# Patient Record
Sex: Male | Born: 1965
Health system: Southern US, Community
[De-identification: ages and names within clinical notes are randomized; demographics above are authoritative.]

## PROBLEM LIST (undated history)

## (undated) DIAGNOSIS — S62509A Fracture of unspecified phalanx of unspecified thumb, initial encounter for closed fracture: Secondary | ICD-10-CM

## (undated) DIAGNOSIS — E781 Pure hyperglyceridemia: Secondary | ICD-10-CM

## (undated) DIAGNOSIS — B009 Herpesviral infection, unspecified: Secondary | ICD-10-CM

## (undated) DIAGNOSIS — H9190 Unspecified hearing loss, unspecified ear: Secondary | ICD-10-CM

## (undated) DIAGNOSIS — H9319 Tinnitus, unspecified ear: Secondary | ICD-10-CM

## (undated) DIAGNOSIS — R7303 Prediabetes: Secondary | ICD-10-CM

## (undated) DIAGNOSIS — F410 Panic disorder [episodic paroxysmal anxiety] without agoraphobia: Secondary | ICD-10-CM

## (undated) DIAGNOSIS — E8881 Metabolic syndrome: Secondary | ICD-10-CM

## (undated) DIAGNOSIS — F1290 Cannabis use, unspecified, uncomplicated: Secondary | ICD-10-CM

## (undated) DIAGNOSIS — S62609A Fracture of unspecified phalanx of unspecified finger, initial encounter for closed fracture: Secondary | ICD-10-CM

## (undated) DIAGNOSIS — Z862 Personal history of diseases of the blood and blood-forming organs and certain disorders involving the immune mechanism: Secondary | ICD-10-CM

## (undated) DIAGNOSIS — E041 Nontoxic single thyroid nodule: Secondary | ICD-10-CM

## (undated) DIAGNOSIS — G43909 Migraine, unspecified, not intractable, without status migrainosus: Secondary | ICD-10-CM

## (undated) HISTORY — DX: Fracture of unspecified phalanx of unspecified finger, initial encounter for closed fracture: S62.609A

## (undated) HISTORY — DX: Fracture of unspecified phalanx of unspecified thumb, initial encounter for closed fracture: S62.509A

## (undated) HISTORY — DX: Tinnitus, unspecified ear: H93.19

## (undated) HISTORY — DX: Cannabis use, unspecified, uncomplicated: F12.90

## (undated) HISTORY — DX: Prediabetes: R73.03

## (undated) HISTORY — DX: Metabolic syndrome: E88.810

## (undated) HISTORY — DX: Pure hyperglyceridemia: E78.1

## (undated) HISTORY — DX: Panic disorder (episodic paroxysmal anxiety): F41.0

## (undated) HISTORY — DX: Personal history of diseases of the blood and blood-forming organs and certain disorders involving the immune mechanism: Z86.2

## (undated) HISTORY — DX: Migraine, unspecified, not intractable, without status migrainosus: G43.909

## (undated) HISTORY — DX: Unspecified hearing loss, unspecified ear: H91.90

## (undated) HISTORY — DX: Metabolic syndrome: E88.81

## (undated) HISTORY — DX: Nontoxic single thyroid nodule: E04.1

## (undated) HISTORY — PX: WRIST SURGERY: SHX841

## (undated) HISTORY — PX: OTHER SURGICAL HISTORY: SHX169

## (undated) HISTORY — DX: Herpesviral infection, unspecified: B00.9

---

## 2015-12-16 ENCOUNTER — Emergency Department (HOSPITAL_COMMUNITY): Payer: BLUE CROSS/BLUE SHIELD

## 2015-12-16 ENCOUNTER — Inpatient Hospital Stay (HOSPITAL_COMMUNITY)
Admission: EM | Admit: 2015-12-16 | Discharge: 2015-12-19 | DRG: 511 | Disposition: A | Discharge status: Home Health | Payer: BLUE CROSS/BLUE SHIELD | Attending: Orthopedic Surgery | Admitting: Orthopedic Surgery

## 2015-12-16 DIAGNOSIS — Z791 Long term (current) use of non-steroidal anti-inflammatories (NSAID): Secondary | ICD-10-CM

## 2015-12-16 DIAGNOSIS — S52611A Displaced fracture of right ulna styloid process, initial encounter for closed fracture: Secondary | ICD-10-CM | POA: Diagnosis present

## 2015-12-16 DIAGNOSIS — S52571A Other intraarticular fracture of lower end of right radius, initial encounter for closed fracture: Principal | ICD-10-CM | POA: Diagnosis present

## 2015-12-16 DIAGNOSIS — IMO0001 Reserved for inherently not codable concepts without codable children: Secondary | ICD-10-CM

## 2015-12-16 DIAGNOSIS — S32810A Multiple fractures of pelvis with stable disruption of pelvic ring, initial encounter for closed fracture: Secondary | ICD-10-CM

## 2015-12-16 DIAGNOSIS — Z880 Allergy status to penicillin: Secondary | ICD-10-CM

## 2015-12-16 DIAGNOSIS — W1789XA Other fall from one level to another, initial encounter: Secondary | ICD-10-CM | POA: Diagnosis present

## 2015-12-16 DIAGNOSIS — Y9351 Activity, roller skating (inline) and skateboarding: Secondary | ICD-10-CM

## 2015-12-16 DIAGNOSIS — S32591A Other specified fracture of right pubis, initial encounter for closed fracture: Secondary | ICD-10-CM | POA: Diagnosis present

## 2015-12-16 DIAGNOSIS — T148XXA Other injury of unspecified body region, initial encounter: Secondary | ICD-10-CM

## 2015-12-16 DIAGNOSIS — S52501A Unspecified fracture of the lower end of right radius, initial encounter for closed fracture: Secondary | ICD-10-CM | POA: Diagnosis present

## 2015-12-16 DIAGNOSIS — Z7984 Long term (current) use of oral hypoglycemic drugs: Secondary | ICD-10-CM | POA: Diagnosis not present

## 2015-12-16 DIAGNOSIS — G5601 Carpal tunnel syndrome, right upper limb: Secondary | ICD-10-CM | POA: Diagnosis present

## 2015-12-16 DIAGNOSIS — T148 Other injury of unspecified body region: Secondary | ICD-10-CM | POA: Diagnosis present

## 2015-12-16 DIAGNOSIS — S43016A Anterior dislocation of unspecified humerus, initial encounter: Secondary | ICD-10-CM | POA: Diagnosis present

## 2015-12-16 DIAGNOSIS — S43004A Unspecified dislocation of right shoulder joint, initial encounter: Secondary | ICD-10-CM

## 2015-12-16 DIAGNOSIS — S62101A Fracture of unspecified carpal bone, right wrist, initial encounter for closed fracture: Secondary | ICD-10-CM

## 2015-12-16 LAB — CBC WITH DIFFERENTIAL/PLATELET
Basophils Absolute: 0 10*3/uL (ref 0.0–0.1)
Basophils Relative: 0 %
EOS ABS: 0 10*3/uL (ref 0.0–0.7)
EOS PCT: 0 %
HCT: 40.5 % (ref 39.0–52.0)
Hemoglobin: 14.2 g/dL (ref 13.0–17.0)
LYMPHS ABS: 1.1 10*3/uL (ref 0.7–4.0)
Lymphocytes Relative: 13 %
MCH: 29.5 pg (ref 26.0–34.0)
MCHC: 35.1 g/dL (ref 30.0–36.0)
MCV: 84.2 fL (ref 78.0–100.0)
MONOS PCT: 9 %
Monocytes Absolute: 0.8 10*3/uL (ref 0.1–1.0)
Neutro Abs: 6.8 10*3/uL (ref 1.7–7.7)
Neutrophils Relative %: 78 %
PLATELETS: 200 10*3/uL (ref 150–400)
RBC: 4.81 MIL/uL (ref 4.22–5.81)
RDW: 12.4 % (ref 11.5–15.5)
WBC: 8.6 10*3/uL (ref 4.0–10.5)

## 2015-12-16 LAB — BASIC METABOLIC PANEL
Anion gap: 6 (ref 5–15)
BUN: 10 mg/dL (ref 6–20)
CALCIUM: 8.9 mg/dL (ref 8.9–10.3)
CO2: 25 mmol/L (ref 22–32)
Chloride: 106 mmol/L (ref 101–111)
Creatinine, Ser: 0.82 mg/dL (ref 0.61–1.24)
GFR calc Af Amer: >60 mL/min (ref >60)
GLUCOSE: 100 mg/dL — AB (ref 65–99)
Potassium: 3.7 mmol/L (ref 3.5–5.1)
Sodium: 137 mmol/L (ref 135–145)

## 2015-12-16 MED ORDER — METHOCARBAMOL 1000 MG/10ML IJ SOLN
500.0000 mg | Freq: Four times a day (QID) | INTRAVENOUS | Status: DC | PRN
  Filled 2015-12-16: qty 5

## 2015-12-16 MED ORDER — ONDANSETRON HCL 4 MG/2ML IJ SOLN
4.0000 mg | Freq: Once | INTRAMUSCULAR | Status: AC
  Administered 2015-12-16: 4 mg via INTRAVENOUS
  Filled 2015-12-16: qty 2

## 2015-12-16 MED ORDER — PROPOFOL 10 MG/ML IV BOLUS
200.0000 mg | Freq: Once | INTRAVENOUS | Status: AC
  Administered 2015-12-16: 200 mg via INTRAVENOUS
  Administered 2015-12-16: 100 mg via INTRAVENOUS
  Filled 2015-12-16: qty 20

## 2015-12-16 MED ORDER — CHLORHEXIDINE GLUCONATE 4 % EX LIQD
60.0000 mL | Freq: Once | CUTANEOUS | Status: DC
  Filled 2015-12-16: qty 60

## 2015-12-16 MED ORDER — METHOCARBAMOL 500 MG PO TABS
500.0000 mg | ORAL_TABLET | Freq: Four times a day (QID) | ORAL | Status: DC | PRN
  Administered 2015-12-16 – 2015-12-19 (×7): 500 mg via ORAL
  Filled 2015-12-16 (×7): qty 1

## 2015-12-16 MED ORDER — DIPHENHYDRAMINE HCL 12.5 MG/5ML PO ELIX
12.5000 mg | ORAL_SOLUTION | ORAL | Status: DC | PRN
  Administered 2015-12-17: 25 mg via ORAL
  Filled 2015-12-16: qty 10

## 2015-12-16 MED ORDER — CLINDAMYCIN PHOSPHATE 900 MG/50ML IV SOLN: 900.0000 mg | INTRAVENOUS | Status: DC

## 2015-12-16 MED ORDER — ONDANSETRON HCL 4 MG/2ML IJ SOLN
4.0000 mg | Freq: Four times a day (QID) | INTRAMUSCULAR | Status: DC | PRN
  Administered 2015-12-16: 4 mg via INTRAVENOUS
  Filled 2015-12-16: qty 2

## 2015-12-16 MED ORDER — FENTANYL CITRATE (PF) 100 MCG/2ML IJ SOLN
100.0000 ug | INTRAMUSCULAR | Status: DC | PRN
  Administered 2015-12-16 (×2): 100 ug via INTRAVENOUS
  Filled 2015-12-16 (×2): qty 2

## 2015-12-16 MED ORDER — SODIUM CHLORIDE 0.9 % IV BOLUS (SEPSIS)
500.0000 mL | Freq: Once | INTRAVENOUS | Status: AC
  Administered 2015-12-16: 500 mL via INTRAVENOUS

## 2015-12-16 MED ORDER — HYDROMORPHONE HCL 1 MG/ML IJ SOLN
1.0000 mg | INTRAMUSCULAR | Status: DC | PRN
  Administered 2015-12-16 – 2015-12-18 (×13): 1 mg via INTRAVENOUS
  Filled 2015-12-16 (×12): qty 1

## 2015-12-16 MED ORDER — METFORMIN HCL 500 MG PO TABS
500.0000 mg | ORAL_TABLET | Freq: Two times a day (BID) | ORAL | Status: DC
  Administered 2015-12-16 – 2015-12-19 (×6): 500 mg via ORAL
  Filled 2015-12-16 (×6): qty 1

## 2015-12-16 MED ORDER — ONDANSETRON HCL 4 MG PO TABS
4.0000 mg | ORAL_TABLET | Freq: Four times a day (QID) | ORAL | Status: DC | PRN
  Administered 2015-12-17: 4 mg via ORAL
  Filled 2015-12-16 (×2): qty 1

## 2015-12-16 MED ORDER — PROPOFOL 10 MG/ML IV BOLUS
INTRAVENOUS | Status: AC
Start: 2015-12-16 — End: 2015-12-16
  Administered 2015-12-16: 100 mg via INTRAVENOUS
  Filled 2015-12-16: qty 20

## 2015-12-16 MED ORDER — ACETAMINOPHEN 325 MG PO TABS
650.0000 mg | ORAL_TABLET | Freq: Four times a day (QID) | ORAL | Status: DC | PRN
  Administered 2015-12-17: 650 mg via ORAL
  Filled 2015-12-16 (×2): qty 2

## 2015-12-16 MED ORDER — POVIDONE-IODINE 10 % EX SWAB: 2.0000 "application " | Freq: Once | CUTANEOUS | Status: DC

## 2015-12-16 MED ORDER — OXYCODONE HCL 5 MG PO TABS
5.0000 mg | ORAL_TABLET | ORAL | Status: DC | PRN
  Administered 2015-12-16 – 2015-12-19 (×12): 10 mg via ORAL
  Filled 2015-12-16 (×12): qty 2

## 2015-12-16 MED ORDER — BUPIVACAINE HCL (PF) 0.5 % IJ SOLN
20.0000 mL | Freq: Once | INTRAMUSCULAR | Status: AC
  Administered 2015-12-16: 20 mL
  Filled 2015-12-16: qty 20

## 2015-12-16 MED ORDER — CLINDAMYCIN PHOSPHATE 900 MG/50ML IV SOLN
900.0000 mg | INTRAVENOUS | Status: AC
  Administered 2015-12-17: 900 mg via INTRAVENOUS
  Filled 2015-12-16 (×2): qty 50

## 2015-12-16 MED ORDER — BISACODYL 5 MG PO TBEC: 5.0000 mg | DELAYED_RELEASE_TABLET | Freq: Every day | ORAL | Status: DC | PRN

## 2015-12-16 MED ORDER — FENTANYL CITRATE (PF) 100 MCG/2ML IJ SOLN
50.0000 ug | INTRAMUSCULAR | Status: DC | PRN
  Administered 2015-12-16: 50 ug via INTRAVENOUS
  Filled 2015-12-16 (×2): qty 2

## 2015-12-16 MED ORDER — ACETAMINOPHEN 650 MG RE SUPP: 650.0000 mg | Freq: Four times a day (QID) | RECTAL | Status: DC | PRN

## 2015-12-16 NOTE — H&P (Signed)
Jason [name] is an 50 y.o. male.   Chief Complaint: right wrist and shoulder injury HPI: Pt fell skateboarding, sustained closed right distal radius fracture and glenohumeral dislocation and right rami fractures, Pt being admitted for surgery planned on right wrist and pain control. Pt with some numbness and tingling in right hand and history of right hand carpal tunnel syndrome No surgery to right wrist. Has had surgery on left thumb and left elbow after falls.  No past medical history on file.  No past surgical history on file.  No family history on file. Social History:  has no tobacco, alcohol, and drug history on file.  Allergies:  Allergies  Allergen Reactions  . Penicillins Rash     (Not in a hospital admission)  No results found for this or any previous visit (from the past 48 hour(s)). Dg Pelvis 1-2 Views  12/16/2015  CLINICAL DATA:  Pain after 10 foot fall. EXAM: PELVIS - 1-2 VIEW COMPARISON:  None. FINDINGS: There appears to be minimally displaced fractures involving the right inferior and superior pubic rami. Hip and sacroiliac joints appear normal. IMPRESSION: Probable minimally displaced fractures involving the right inferior and superior pubic rami. Electronically Signed   By: Lupita Raider, M.D.   On: 12/16/2015 12:50   Dg Shoulder Right  12/16/2015  CLINICAL DATA:  Postreduction right shoulder anterior dislocation EXAM: RIGHT SHOULDER - 2+ VIEW COMPARISON:  Study obtained earlier in the day FINDINGS: Frontal and Y scapular images obtained. The anterior dislocation noted earlier in the day has been reduced successfully. Currently no dislocation. No acute fracture evident. Prior fracture of the junction of the mid and lateral thirds of the right clavicle again noted with 3.5 cm of overriding of fracture fragments. There old healed rib fractures on the right as well. Joint spaces appear unremarkable. IMPRESSION: Reduction of anterior shoulder dislocation. Prior  fractures of the right clavicle in multiple ribs. No acute fracture or dislocation evident on this current examination. Electronically Signed   By: Bretta Bang III M.D.   On: 12/16/2015 16:21   Dg Shoulder Right  12/16/2015  CLINICAL DATA:  Severe right shoulder pain after [place] 10 feet at [employer] [place]. History of right clavicle fracture. EXAM: RIGHT SHOULDER - 2+ VIEW COMPARISON:  None. FINDINGS: There is anterior glenohumeral dislocation without definite associated acute fracture. There is a fracture of the mid right clavicle which remains medially and inferiorly displaced. This may be incompletely healed. The acromioclavicular joint appears intact. There are multiple old right-sided rib fractures. IMPRESSION: 1. Acute anterior dislocation of the glenohumeral joint. No associated acute fracture identified. 2. Mid right clavicle fracture with persistent displacement and possible nonunion. Electronically Signed   By: Carey Bullocks M.D.   On: 12/16/2015 12:49   Dg Forearm Right  12/16/2015  CLINICAL DATA:  Patient fell from 10 ft at [employer] [place] and landed onto his right side. Patient is having severe right shoulder pain that radiates to his right elbow. Patient had little mobility of his right arm due to pain. History of clavicle and wrist fracture. EXAM: RIGHT FOREARM - 2 VIEW COMPARISON:  None. FINDINGS: There is an acute intra-articular fracture of the distal radius which is impacted, moderately comminuted and displaced. The displacement is primarily in the radial and dorsal directions. There is and mildly displaced fracture of the ulnar styloid. No definite carpal bone fractures identified on these views. The elbow is incompletely visualized but grossly intact. IMPRESSION: Displaced and impacted intra-articular fractures of the distal  radius and ulna as described. Electronically Signed   By: Carey Bullocks M.D.   On: 12/16/2015 12:51   Dg Wrist 2 Views Right  12/16/2015  CLINICAL DATA:  [place],  postreduction. Subsequent encounter. EXAM: RIGHT WRIST - 2 VIEW COMPARISON:  Plain films earlier today FINDINGS: Comminuted distal radius fracture and ulnar styloid fracture demonstrating improved position and alignment following closed reduction. Overlying splint. IMPRESSION: Improved appearance status post closed reduction. Electronically Signed   By: Elsie Stain M.D.   On: 12/16/2015 16:14   Ct Head Wo Contrast  12/16/2015  CLINICAL DATA:  10 foot fall with right arm injury. Initial encounter. EXAM: CT HEAD WITHOUT CONTRAST CT CERVICAL SPINE WITHOUT CONTRAST TECHNIQUE: Multidetector CT imaging of the head and cervical spine was performed following the standard protocol without intravenous contrast. Multiplanar CT image reconstructions of the cervical spine were also generated. COMPARISON:  None. FINDINGS: Known right glenohumeral dislocation and remote clavicle fracture. CT HEAD FINDINGS Skull and Sinuses:Negative for fracture or destructive process. The visualized mastoids, middle ears, and imaged paranasal sinuses are clear. Visualized orbits: Negative. Brain: Normal. No evidence of acute infarction, hemorrhage, hydrocephalus, or mass lesion/mass effect. CT CERVICAL SPINE FINDINGS Negative for acute fracture or subluxation. No prevertebral edema. No gross cervical canal hematoma. 35 mm (craniocaudal) left thyroid nodule projecting posteriorly. IMPRESSION: 1. No evidence of intracranial or cervical spine injury. 2. Right glenohumeral dislocation. 3. 35 mm left thyroid nodule. Recommend sonography, likely to be followed by biopsy. Electronically Signed   By: Marnee Spring M.D.   On: 12/16/2015 13:11   Ct Cervical Spine Wo Contrast  12/16/2015  CLINICAL DATA:  10 foot fall with right arm injury. Initial encounter. EXAM: CT HEAD WITHOUT CONTRAST CT CERVICAL SPINE WITHOUT CONTRAST TECHNIQUE: Multidetector CT imaging of the head and cervical spine was performed following the standard protocol without  intravenous contrast. Multiplanar CT image reconstructions of the cervical spine were also generated. COMPARISON:  None. FINDINGS: Known right glenohumeral dislocation and remote clavicle fracture. CT HEAD FINDINGS Skull and Sinuses:Negative for fracture or destructive process. The visualized mastoids, middle ears, and imaged paranasal sinuses are clear. Visualized orbits: Negative. Brain: Normal. No evidence of acute infarction, hemorrhage, hydrocephalus, or mass lesion/mass effect. CT CERVICAL SPINE FINDINGS Negative for acute fracture or subluxation. No prevertebral edema. No gross cervical canal hematoma. 35 mm (craniocaudal) left thyroid nodule projecting posteriorly. IMPRESSION: 1. No evidence of intracranial or cervical spine injury. 2. Right glenohumeral dislocation. 3. 35 mm left thyroid nodule. Recommend sonography, likely to be followed by biopsy. Electronically Signed   By: Marnee Spring M.D.   On: 12/16/2015 13:11   Dg Chest Port 1 View  12/16/2015  CLINICAL DATA:  Patient fell from 10 ft at [employer] [place] and landed onto his right side. Patient is having severe right shoulder pain that radiates to his right elbow. Patient had little mobility of his right arm due to pain. EXAM: PORTABLE CHEST 1 VIEW COMPARISON:  None. FINDINGS: Two views are submitted. The heart size and mediastinal contours are normal without evidence of mediastinal hematoma. The lungs are clear. There is no pleural effusion or pneumothorax. There are multiple old right-sided rib fractures. There is a fracture of the mid right clavicle which is displaced and possibly incompletely healed. No acute fractures are identified within the chest. Anterior glenohumeral dislocation is noted on the right. IMPRESSION: No acute chest findings. Right glenohumeral dislocation, old right-sided rib fractures and possible nonunion of mid right clavicle fracture. Electronically Signed  By: Carey BullocksWilliam  Veazey M.D.   On: 12/16/2015 12:53    ROS NO RECENT  ILLNESSES OR HOSPITALIZATIONS  Blood pressure 130/82, pulse 62, temperature 98.9 F (37.2 C), temperature source Oral, resp. rate 19, SpO2 98 %. Physical Exam  General Appearance:  Alert, cooperative, no distress, appears stated age  Head:  Normocephalic, without obvious abnormality, atraumatic  Eyes:  Pupils equal, conjunctiva/corneas clear,         Throat: Lips, mucosa, and tongue normal; teeth and gums normal  Neck: No visible masses     Lungs:   respirations unlabored  Chest Wall:  No tenderness or deformity  Heart:  Regular rate and rhythm,  Abdomen:   Soft, non-tender,         Extremities: RUE: SLING IN PLACE ABLE TO EXTEND THUMB, FLEX THUMB IP JOINT ABLE TO FLEX AND EXTEND FINGERS FINGERS WARM WELL PERFUSED  BLE: ABLE TO FLEX AND EXTEND ANKLE AND TOES NO DEFORMITIES IN LEGS GOOD PERFUSION TO TOES  Pulses: 2+ and symmetric  Skin: Skin color, texture, turgor normal, no rashes or lesions     Neurologic: Normal    Assessment/Plan RIGHT COMMINUTED DISTAL RADIUS AND ULNA FRACTURE, RIGHT HAND CARPAL TUNNEL SYNDROME RIGHT GLENOHUMERAL DISLOCATION RIGHT SUP/INFERIIOR RAMI FRACTURES  ADMIT FOR RIGHT WRIST SURGERY AND CARPAL TUNNEL RELEASE CONTINUE WITH SLING FOR SHOULDER WBAT FOR PELVIC FRACTURES, NO SURGERY RECOMMENDED IV PAIN MEDS FOR PAIN CONTROL FOR MULTIPLE FRACTURES NPO AT MIDNIGHT SHOULDER SLING FOR RIGHT SHOULDER, REDUCED IN ED CONTINUE WITH SPLINT FOR RIGHT WRIST, REDUCED IN ED ALSO BY ED STAFF   Sharma CovertORTMANN,Laure Leone W 12/16/2015, 5:01 PM

## 2015-12-16 NOTE — ED Notes (Signed)
Pt transported to xray 

## 2015-12-16 NOTE — Sedation Documentation (Signed)
50 propofol

## 2015-12-16 NOTE — Progress Notes (Signed)
Orthopedic Tech Progress Note Patient Details:  Jason [name] [birth year] [id number] Assisted with reduction of shoulder and reduction of wrist, then applied fiberglass sugar tong splint to RUE.  Pulses, sensation, motion intact before and after splinting.   Capillary refill less than 2 seconds before and after splinting.  Placed splinted RUE in arm sling. Ortho Devices Type of Ortho Device: Sugartong splint, Arm sling Ortho Device/Splint Location: RUE Ortho Device/Splint Interventions: Application   [name], [name] 12/16/2015, 3:22 PM

## 2015-12-16 NOTE — Sedation Documentation (Signed)
Family at bedside. 

## 2015-12-16 NOTE — Sedation Documentation (Signed)
100 propofol

## 2015-12-16 NOTE — Consult Note (Signed)
ORTHOPAEDIC CONSULTATION  REQUESTING PHYSICIAN: Iran Planas, MD  Chief Complaint: right shoulder dislocation, rami fractures  HPI: Jason [name] is a 50 y.o. male who complains of  He fell skateboarding and c/o R shoulder/wrist pain. Pelvic pain  No past medical history on file. No past surgical history on file. Social History   Social History  . Marital Status: Single    Spouse Name: N/A  . Number of Children: N/A  . Years of Education: N/A   Social History Main Topics  . Smoking status: Not on file  . Smokeless tobacco: Not on file  . Alcohol Use: Not on file  . Drug Use: Not on file  . Sexual Activity: Not on file   Other Topics Concern  . Not on file   Social History Narrative  . No narrative on file   No family history on file. Allergies  Allergen Reactions  . Penicillins Rash   Prior to Admission medications   Medication Sig Start Date End Date Taking? Authorizing Provider  ibuprofen (ADVIL,MOTRIN) 200 MG tablet Take 200 mg by mouth every 6 (six) hours as needed (pain).   Yes Historical Provider, MD  METFORMIN HCL PO Take 1 tablet by mouth 2 (two) times daily.   Yes Historical Provider, MD   Dg Pelvis 1-2 Views  12/16/2015  CLINICAL DATA:  Pain after 10 foot fall. EXAM: PELVIS - 1-2 VIEW COMPARISON:  None. FINDINGS: There appears to be minimally displaced fractures involving the right inferior and superior pubic rami. Hip and sacroiliac joints appear normal. IMPRESSION: Probable minimally displaced fractures involving the right inferior and superior pubic rami. Electronically Signed   By: Marijo Conception, M.D.   On: 12/16/2015 12:50   Dg Shoulder Right  12/16/2015  CLINICAL DATA:  Postreduction right shoulder anterior dislocation EXAM: RIGHT SHOULDER - 2+ VIEW COMPARISON:  Study obtained earlier in the day FINDINGS: Frontal and Y scapular images obtained. The anterior dislocation noted earlier in the day has been reduced successfully. Currently no  dislocation. No acute fracture evident. Prior fracture of the junction of the mid and lateral thirds of the right clavicle again noted with 3.5 cm of overriding of fracture fragments. There old healed rib fractures on the right as well. Joint spaces appear unremarkable. IMPRESSION: Reduction of anterior shoulder dislocation. Prior fractures of the right clavicle in multiple ribs. No acute fracture or dislocation evident on this current examination. Electronically Signed   By: Lowella Grip III M.D.   On: 12/16/2015 16:21   Dg Shoulder Right  12/16/2015  CLINICAL DATA:  Severe right shoulder pain after falling 10 feet at [employer] park. History of right clavicle fracture. EXAM: RIGHT SHOULDER - 2+ VIEW COMPARISON:  None. FINDINGS: There is anterior glenohumeral dislocation without definite associated acute fracture. There is a fracture of the mid right clavicle which remains medially and inferiorly displaced. This may be incompletely healed. The acromioclavicular joint appears intact. There are multiple old right-sided rib fractures. IMPRESSION: 1. Acute anterior dislocation of the glenohumeral joint. No associated acute fracture identified. 2. Mid right clavicle fracture with persistent displacement and possible nonunion. Electronically Signed   By: Richardean Sale M.D.   On: 12/16/2015 12:49   Dg Forearm Right  12/16/2015  CLINICAL DATA:  Patient fell from 10 ft at [employer] park and landed onto his right side. Patient is having severe right shoulder pain that radiates to his right elbow. Patient had little mobility of his right arm due to pain. History  of clavicle and wrist fracture. EXAM: RIGHT FOREARM - 2 VIEW COMPARISON:  None. FINDINGS: There is an acute intra-articular fracture of the distal radius which is impacted, moderately comminuted and displaced. The displacement is primarily in the radial and dorsal directions. There is and mildly displaced fracture of the ulnar styloid. No definite carpal bone  fractures identified on these views. The elbow is incompletely visualized but grossly intact. IMPRESSION: Displaced and impacted intra-articular fractures of the distal radius and ulna as described. Electronically Signed   By: Richardean Sale M.D.   On: 12/16/2015 12:51   Dg Wrist 2 Views Right  12/16/2015  CLINICAL DATA:  [place], postreduction. Subsequent encounter. EXAM: RIGHT WRIST - 2 VIEW COMPARISON:  Plain films earlier today FINDINGS: Comminuted distal radius fracture and ulnar styloid fracture demonstrating improved position and alignment following closed reduction. Overlying splint. IMPRESSION: Improved appearance status post closed reduction. Electronically Signed   By: Staci Righter M.D.   On: 12/16/2015 16:14   Ct Head Wo Contrast  12/16/2015  CLINICAL DATA:  10 foot fall with right arm injury. Initial encounter. EXAM: CT HEAD WITHOUT CONTRAST CT CERVICAL SPINE WITHOUT CONTRAST TECHNIQUE: Multidetector CT imaging of the head and cervical spine was performed following the standard protocol without intravenous contrast. Multiplanar CT image reconstructions of the cervical spine were also generated. COMPARISON:  None. FINDINGS: Known right glenohumeral dislocation and remote clavicle fracture. CT HEAD FINDINGS Skull and Sinuses:Negative for fracture or destructive process. The visualized mastoids, middle ears, and imaged paranasal sinuses are clear. Visualized orbits: Negative. Brain: Normal. No evidence of acute infarction, hemorrhage, hydrocephalus, or mass lesion/mass effect. CT CERVICAL SPINE FINDINGS Negative for acute fracture or subluxation. No prevertebral edema. No gross cervical canal hematoma. 35 mm (craniocaudal) left thyroid nodule projecting posteriorly. IMPRESSION: 1. No evidence of intracranial or cervical spine injury. 2. Right glenohumeral dislocation. 3. 35 mm left thyroid nodule. Recommend sonography, likely to be followed by biopsy. Electronically Signed   By: Monte Fantasia M.D.    On: 12/16/2015 13:11   Ct Cervical Spine Wo Contrast  12/16/2015  CLINICAL DATA:  10 foot fall with right arm injury. Initial encounter. EXAM: CT HEAD WITHOUT CONTRAST CT CERVICAL SPINE WITHOUT CONTRAST TECHNIQUE: Multidetector CT imaging of the head and cervical spine was performed following the standard protocol without intravenous contrast. Multiplanar CT image reconstructions of the cervical spine were also generated. COMPARISON:  None. FINDINGS: Known right glenohumeral dislocation and remote clavicle fracture. CT HEAD FINDINGS Skull and Sinuses:Negative for fracture or destructive process. The visualized mastoids, middle ears, and imaged paranasal sinuses are clear. Visualized orbits: Negative. Brain: Normal. No evidence of acute infarction, hemorrhage, hydrocephalus, or mass lesion/mass effect. CT CERVICAL SPINE FINDINGS Negative for acute fracture or subluxation. No prevertebral edema. No gross cervical canal hematoma. 35 mm (craniocaudal) left thyroid nodule projecting posteriorly. IMPRESSION: 1. No evidence of intracranial or cervical spine injury. 2. Right glenohumeral dislocation. 3. 35 mm left thyroid nodule. Recommend sonography, likely to be followed by biopsy. Electronically Signed   By: Monte Fantasia M.D.   On: 12/16/2015 13:11   Dg Chest Port 1 View  12/16/2015  CLINICAL DATA:  Patient fell from 10 ft at [employer] park and landed onto his right side. Patient is having severe right shoulder pain that radiates to his right elbow. Patient had little mobility of his right arm due to pain. EXAM: PORTABLE CHEST 1 VIEW COMPARISON:  None. FINDINGS: Two views are submitted. The heart size and mediastinal contours are normal without  evidence of mediastinal hematoma. The lungs are clear. There is no pleural effusion or pneumothorax. There are multiple old right-sided rib fractures. There is a fracture of the mid right clavicle which is displaced and possibly incompletely healed. No acute fractures are  identified within the chest. Anterior glenohumeral dislocation is noted on the right. IMPRESSION: No acute chest findings. Right glenohumeral dislocation, old right-sided rib fractures and possible nonunion of mid right clavicle fracture. Electronically Signed   By: Richardean Sale M.D.   On: 12/16/2015 12:53    Positive ROS: All other systems have been reviewed and were otherwise negative with the exception of those mentioned in the HPI and as above.  Labs cbc  Recent Labs  12/16/15 1822  WBC 8.6  HGB 14.2  HCT 40.5  PLT 200    Labs inflam No results for input(s): CRP in the last 72 hours.  Invalid input(s): ESR  Labs coag No results for input(s): INR, PTT in the last 72 hours.  Invalid input(s): PT   Recent Labs  12/16/15 1822  NA 137  K 3.7  CL 106  CO2 25  GLUCOSE 100*  BUN 10  CREATININE 0.82  CALCIUM 8.9    Physical Exam: Filed Vitals:   12/17/15 0509 12/17/15 0733  BP: 138/65   Pulse: 71   Temp: 98.4 F (36.9 C) 97.9 F (36.6 C)  Resp: 19    General: Alert, no acute distress Cardiovascular: No pedal edema Respiratory: No cyanosis, no use of accessory musculature GI: No organomegaly, abdomen is soft and non-tender Skin: No lesions in the area of chief complaint other than those listed below in MSK exam.  Neurologic: Sensation intact distally save for the below mentioned MSK exam Psychiatric: Patient is competent for consent with normal mood and affect Lymphatic: No axillary or cervical lymphadenopathy  MUSCULOSKELETAL:  RUE: splint intact, some median nerve parasthesia but motor intact compartments soft. Some TTP at shoulder BLE: compartments soft, painless ROM Other extremities are atraumatic with painless ROM and NVI.  Assessment: Right rami fracture R GH dislocation  Plan: WBAT BLE non-op for pelvic fx Sling RUE MRI to eval cuff RUE   Renette Butters, MD Cell [phone]   12/17/2015 10:06 AM

## 2015-12-16 NOTE — ED Provider Notes (Signed)
CSN: [id number]     Arrival date & time 12/16/15  1137 History   First MD Initiated Contact with Patient 12/16/15 1142     Chief Complaint  Patient presents with  . Fall  . Arm Injury      HPI  She presents for evaluation after a fall while skating at a skate park. Landed on an outstretched right upper extremity and onto his right hip and buttock. He can walk under his pain in his pelvis. Close deformity of the right wrist. Deformity the right shoulder. No strike to the head. He was wearing a helmet. No risk guards  No past medical history on file. No past surgical history on file. No family history on file. Social History  Substance Use Topics  . Smoking status: Not on file  . Smokeless tobacco: Not on file  . Alcohol Use: Not on file    Review of Systems  Constitutional: Negative for fever, chills, diaphoresis, appetite change and fatigue.  HENT: Negative for mouth sores, sore throat and trouble swallowing.   Eyes: Negative for visual disturbance.  Respiratory: Negative for cough, chest tightness, shortness of breath and wheezing.   Cardiovascular: Negative for chest pain.  Gastrointestinal: Negative for nausea, vomiting, abdominal pain, diarrhea and abdominal distention.  Endocrine: Negative for polydipsia, polyphagia and polyuria.  Genitourinary: Negative for dysuria, frequency and hematuria.  Musculoskeletal: Negative for gait problem.       Right shoulder pain and deformity. Right wrist pain and deformity. No headache or neck pain. He has pain in his pelvis above his right groin with ambulation.  Skin: Negative for color change, pallor and rash.  Neurological: Negative for dizziness, syncope, light-headedness and headaches.       Complains of numbness in his right hand.  Hematological: Does not bruise/bleed easily.  Psychiatric/Behavioral: Negative for behavioral problems and confusion.      Allergies  Penicillins  Home Medications   Prior to Admission  medications   Medication Sig Start Date End Date Taking? Authorizing Provider  ibuprofen (ADVIL,MOTRIN) 200 MG tablet Take 200 mg by mouth every 6 (six) hours as needed (pain).   Yes Historical Provider, MD  METFORMIN HCL PO Take 1 tablet by mouth 2 (two) times daily.   Yes Historical Provider, MD   BP 130/82 mmHg  Pulse 62  Temp(Src) 98.9 F (37.2 C) (Oral)  Resp 19  SpO2 98% Physical Exam  Constitutional: He is oriented to person, place, and time. He appears well-developed and well-nourished. No distress.  HENT:  Head: Normocephalic.  Eyes: Conjunctivae are normal. Pupils are equal, round, and reactive to light. No scleral icterus.  Neck: Normal range of motion. Neck supple. No thyromegaly present.  Cardiovascular: Normal rate and regular rhythm.  Exam reveals no gallop and no friction rub.   No murmur heard. Pulmonary/Chest: Effort normal and breath sounds normal. No respiratory distress. He has no wheezes. He has no rales.  Abdominal: Soft. Bowel sounds are normal. He exhibits no distension. There is no tenderness. There is no rebound.  Musculoskeletal: Normal range of motion.  Topical anterior dislocation of his right shoulder. Deformity of his right distal forearm. Reports decreased sensation to all digits of the right hand. Good capillary refill. Tenderness in the right suprapubic abdomen and along the pubic symphysis.  Neurological: He is alert and oriented to person, place, and time.  Skin: Skin is warm and dry. No rash noted.  Psychiatric: He has a normal mood and affect. His behavior is normal.  ED Course  Reduction of fracture Performed by: Rolland Porter Authorized by: Rolland Porter Consent: Verbal consent obtained. Written consent obtained. Risks and benefits: risks, benefits and alternatives were discussed Consent given by: patient Patient understanding: patient states understanding of the procedure being performed Patient consent: the patient's understanding of the  procedure matches consent given Procedure consent: procedure consent matches procedure scheduled Relevant documents: relevant documents present and verified Test results: test results available and properly labeled Site marked: the operative site was marked Imaging studies: imaging studies available Patient identity confirmed: verbally with patient and arm band Time out: Immediately prior to procedure a "time out" was called to verify the correct patient, procedure, equipment, support staff and site/side marked as required. Local anesthesia used: yes Anesthesia: hematoma block Local anesthetic: bupivacaine 0.5% without epinephrine Patient sedated: yes Sedatives: propofol Comments: Reduced via hyperextension axial traction and flexion. Splint placed. Patient reports return of "normal feeling". Normal neurovascular exam following splinting.   (including critical care time) Labs Review Labs Reviewed - No data to display  Imaging Review Dg Pelvis 1-2 Views  12/16/2015  CLINICAL DATA:  Pain after 10 foot fall. EXAM: PELVIS - 1-2 VIEW COMPARISON:  None. FINDINGS: There appears to be minimally displaced fractures involving the right inferior and superior pubic rami. Hip and sacroiliac joints appear normal. IMPRESSION: Probable minimally displaced fractures involving the right inferior and superior pubic rami. Electronically Signed   By: Lupita Raider, M.D.   On: 12/16/2015 12:50   Dg Shoulder Right  12/16/2015  CLINICAL DATA:  Severe right shoulder pain after [place] [employer] park. History of right clavicle fracture. EXAM: RIGHT SHOULDER - 2+ VIEW COMPARISON:  None. FINDINGS: There is anterior glenohumeral dislocation without definite associated acute fracture. There is a fracture of the mid right clavicle which remains medially and inferiorly displaced. This may be incompletely healed. The acromioclavicular joint appears intact. There are multiple old right-sided rib fractures.  IMPRESSION: 1. Acute anterior dislocation of the glenohumeral joint. No associated acute fracture identified. 2. Mid right clavicle fracture with persistent displacement and possible nonunion. Electronically Signed   By: Carey Bullocks M.D.   On: 12/16/2015 12:49   Dg Forearm Right  12/16/2015  CLINICAL DATA:  Patient fell from 10 ft at [employer] park and landed onto his right side. Patient is having severe right shoulder pain that radiates to his right elbow. Patient had little mobility of his right arm due to pain. History of clavicle and wrist fracture. EXAM: RIGHT FOREARM - 2 VIEW COMPARISON:  None. FINDINGS: There is an acute intra-articular fracture of the distal radius which is impacted, moderately comminuted and displaced. The displacement is primarily in the radial and dorsal directions. There is and mildly displaced fracture of the ulnar styloid. No definite carpal bone fractures identified on these views. The elbow is incompletely visualized but grossly intact. IMPRESSION: Displaced and impacted intra-articular fractures of the distal radius and ulna as described. Electronically Signed   By: Carey Bullocks M.D.   On: 12/16/2015 12:51   Dg Wrist 2 Views Right  12/16/2015  CLINICAL DATA:  Larey Seat, postreduction. Subsequent encounter. EXAM: RIGHT WRIST - 2 VIEW COMPARISON:  Plain films earlier today FINDINGS: Comminuted distal radius fracture and ulnar styloid fracture demonstrating improved position and alignment following closed reduction. Overlying splint. IMPRESSION: Improved appearance status post closed reduction. Electronically Signed   By: Elsie Stain M.D.   On: 12/16/2015 16:14   Ct Head Wo Contrast  12/16/2015  CLINICAL DATA:  10 foot fall with right arm injury. Initial encounter. EXAM: CT HEAD WITHOUT CONTRAST CT CERVICAL SPINE WITHOUT CONTRAST TECHNIQUE: Multidetector CT imaging of the head and cervical spine was performed following the standard protocol without intravenous contrast.  Multiplanar CT image reconstructions of the cervical spine were also generated. COMPARISON:  None. FINDINGS: Known right glenohumeral dislocation and remote clavicle fracture. CT HEAD FINDINGS Skull and Sinuses:Negative for fracture or destructive process. The visualized mastoids, middle ears, and imaged paranasal sinuses are clear. Visualized orbits: Negative. Brain: Normal. No evidence of acute infarction, hemorrhage, hydrocephalus, or mass lesion/mass effect. CT CERVICAL SPINE FINDINGS Negative for acute fracture or subluxation. No prevertebral edema. No gross cervical canal hematoma. 35 mm (craniocaudal) left thyroid nodule projecting posteriorly. IMPRESSION: 1. No evidence of intracranial or cervical spine injury. 2. Right glenohumeral dislocation. 3. 35 mm left thyroid nodule. Recommend sonography, likely to be followed by biopsy. Electronically Signed   By: Marnee Spring M.D.   On: 12/16/2015 13:11   Ct Cervical Spine Wo Contrast  12/16/2015  CLINICAL DATA:  10 foot fall with right arm injury. Initial encounter. EXAM: CT HEAD WITHOUT CONTRAST CT CERVICAL SPINE WITHOUT CONTRAST TECHNIQUE: Multidetector CT imaging of the head and cervical spine was performed following the standard protocol without intravenous contrast. Multiplanar CT image reconstructions of the cervical spine were also generated. COMPARISON:  None. FINDINGS: Known right glenohumeral dislocation and remote clavicle fracture. CT HEAD FINDINGS Skull and Sinuses:Negative for fracture or destructive process. The visualized mastoids, middle ears, and imaged paranasal sinuses are clear. Visualized orbits: Negative. Brain: Normal. No evidence of acute infarction, hemorrhage, hydrocephalus, or mass lesion/mass effect. CT CERVICAL SPINE FINDINGS Negative for acute fracture or subluxation. No prevertebral edema. No gross cervical canal hematoma. 35 mm (craniocaudal) left thyroid nodule projecting posteriorly. IMPRESSION: 1. No evidence of  intracranial or cervical spine injury. 2. Right glenohumeral dislocation. 3. 35 mm left thyroid nodule. Recommend sonography, likely to be followed by biopsy. Electronically Signed   By: Marnee Spring M.D.   On: 12/16/2015 13:11   Dg Chest Port 1 View  12/16/2015  CLINICAL DATA:  Patient fell from 10 ft [place] [employer] [place] and landed onto his right side. Patient is having severe right shoulder pain that radiates to his right elbow. Patient had little mobility of his right arm due to pain. EXAM: PORTABLE CHEST 1 VIEW COMPARISON:  None. FINDINGS: Two views are submitted. The heart size and mediastinal contours are normal without evidence of mediastinal hematoma. The lungs are clear. There is no pleural effusion or pneumothorax. There are multiple old right-sided rib fractures. There is a fracture of the mid right clavicle which is displaced and possibly incompletely healed. No acute fractures are identified within the chest. Anterior glenohumeral dislocation is noted on the right. IMPRESSION: No acute chest findings. Right glenohumeral dislocation, old right-sided rib fractures and possible nonunion of mid right clavicle fracture. Electronically Signed   By: Carey Bullocks M.D.   On: 12/16/2015 12:53   I have personally reviewed and evaluated these images and lab results as part of my medical decision-making.   EKG Interpretation None      MDM   Final diagnoses:  Wrist fracture, right, closed, initial encounter  Shoulder dislocation, right, initial encounter  Multiple closed fractures of pelvis with stable disruption of pelvic circle, initial encounter (HCC)    Procedural sedation Performed by: Claudean Kinds Consent: Verbal consent obtained. Risks and benefits: risks, benefits and alternatives were discussed Required items: required blood products,  implants, devices, and special equipment available Patient identity confirmed: arm band and provided demographic data Time out: Immediately  prior to procedure a "time out" was called to verify the correct patient, procedure, equipment, support staff and site/side marked as required.  Sedation type: moderate (conscious) sedation NPO time confirmed and considedered  Sedatives: PROPOFOL  Physician Time at Bedside: 330 minutes  Vitals: Vital signs were monitored during sedation. Cardiac Monitor, pulse oximeter Patient tolerance: Patient tolerated the procedure well with no immediate complications. Comments: Pt with uneventful recovered. Returned to pre-procedural sedation baseline  Reduction of dislocation Date/Time: 4:21 PM Performed by: Claudean Kinds Authorized by: Claudean Kinds Consent: Verbal consent obtained. Risks and benefits: risks, benefits and alternatives were discussed Consent given by: patient Required items: required blood products, implants, devices, and special equipment available Time out: Immediately prior to procedure a "time out" was called to verify the correct patient, procedure, equipment, support staff and site/side marked as required.  Patient sedated: Yes  Vitals: Vital signs were monitored during sedation. Patient tolerance: Patient tolerated the procedure well with no immediate complications. Joint: right shoulder Reduction technique: Scapular manipulation  SPLINT APPLICATION Date/Time: 4:24 PM Authorized by: Claudean Kinds Consent: Verbal consent obtained. Risks and benefits: risks, benefits and alternatives were discussed Consent given by: patient Splint applied by: Myself, assisted by orthopedic technician Location details: Rt wrist coaptation Splint type: orthoglass Supplies used: orthoglass, ace Post-procedure: The splinted body part was neurovascularly unchanged following the procedure. Patient tolerance: Patient tolerated the procedure well with no immediate complications.              Rolland Porter, MD 12/16/15 [phone]

## 2015-12-16 NOTE — Sedation Documentation (Addendum)
Given 70mg  more propofol after pt became alert during procedure

## 2015-12-16 NOTE — ED Notes (Signed)
Per EMS - [place] Pt went [place], fell off skateboard halfway up ramp and slid down rest of ramp. Pt was wearing helmet, did not hit head and denies LOC. Obvious deformity right wrist, right shoulder. C/o right hip pain, no deformity/shortening/rotation at this time. Pt c/o neck pain but does have hx chronic neck pain and pt uncertain as to if this pain is worse than normal. Placed in c-collar. Given 100mg  fentanyl with some relief.   Pt concerned about right wrist deformity w/ progressing right hand numbness. Pt reports previous right wrist surgeries. Dr. Fayrene FearingJames aware, no new actions to be taken at this time. Pt will have xrays and CT scans. Pt and family member updated. Pt able to move fingers slightly, good cap refill in fingers of right hand.

## 2015-12-16 NOTE — Sedation Documentation (Signed)
100 propofol 

## 2015-12-16 NOTE — ED Notes (Signed)
Procedure explained by Dr. Fayrene Fearingjames. Consent signed by pt.

## 2015-12-16 NOTE — Sedation Documentation (Signed)
Dr. Fayrene FearingJames to update family

## 2015-12-16 NOTE — Sedation Documentation (Signed)
Right shoulder reduced.

## 2015-12-16 NOTE — Sedation Documentation (Signed)
Right wrist reduced, splinting at this time

## 2015-12-16 NOTE — ED Notes (Signed)
Notified Ortho take

## 2015-12-17 ENCOUNTER — Encounter (HOSPITAL_COMMUNITY): Payer: Self-pay | Admitting: Anesthesiology

## 2015-12-17 ENCOUNTER — Inpatient Hospital Stay (HOSPITAL_COMMUNITY): Payer: BLUE CROSS/BLUE SHIELD

## 2015-12-17 ENCOUNTER — Inpatient Hospital Stay (HOSPITAL_COMMUNITY): Payer: BLUE CROSS/BLUE SHIELD | Admitting: Anesthesiology

## 2015-12-17 ENCOUNTER — Encounter (HOSPITAL_COMMUNITY)
Admission: EM | Disposition: A | Discharge status: Home Health | Payer: Self-pay | Source: Home / Self Care | Attending: Orthopedic Surgery

## 2015-12-17 HISTORY — PX: OPEN REDUCTION INTERNAL FIXATION (ORIF) DISTAL RADIAL FRACTURE: SHX5989

## 2015-12-17 HISTORY — PX: CARPAL TUNNEL RELEASE: SHX101

## 2015-12-17 LAB — SURGICAL PCR SCREEN
MRSA, PCR: NEGATIVE
STAPHYLOCOCCUS AUREUS: NEGATIVE

## 2015-12-17 LAB — GLUCOSE, CAPILLARY
Glucose-Capillary: 142 mg/dL — ABNORMAL HIGH (ref 65–99)
Glucose-Capillary: 118 mg/dL — ABNORMAL HIGH (ref 65–99)

## 2015-12-17 SURGERY — OPEN REDUCTION INTERNAL FIXATION (ORIF) DISTAL RADIUS FRACTURE
Anesthesia: General | Site: Wrist | Laterality: Right

## 2015-12-17 MED ORDER — FENTANYL CITRATE (PF) 100 MCG/2ML IJ SOLN: 25.0000 ug | INTRAMUSCULAR | Status: DC | PRN

## 2015-12-17 MED ORDER — LIDOCAINE HCL (CARDIAC) 20 MG/ML IV SOLN
INTRAVENOUS | Status: DC | PRN
  Administered 2015-12-17: 40 mg via INTRAVENOUS

## 2015-12-17 MED ORDER — METHOCARBAMOL 1000 MG/10ML IJ SOLN: 500.0000 mg | Freq: Four times a day (QID) | INTRAVENOUS | Status: DC | PRN

## 2015-12-17 MED ORDER — FENTANYL CITRATE (PF) 100 MCG/2ML IJ SOLN
INTRAMUSCULAR | Status: DC | PRN
  Administered 2015-12-17 (×2): 50 ug via INTRAVENOUS

## 2015-12-17 MED ORDER — 0.9 % SODIUM CHLORIDE (POUR BTL) OPTIME
TOPICAL | Status: DC | PRN
  Administered 2015-12-17: 1000 mL

## 2015-12-17 MED ORDER — OXYCODONE HCL 5 MG/5ML PO SOLN: 5.0000 mg | Freq: Once | ORAL | Status: DC | PRN

## 2015-12-17 MED ORDER — PROPOFOL 10 MG/ML IV BOLUS
INTRAVENOUS | Status: AC
  Filled 2015-12-17: qty 20

## 2015-12-17 MED ORDER — ONDANSETRON HCL 4 MG PO TABS: 4.0000 mg | ORAL_TABLET | Freq: Four times a day (QID) | ORAL | Status: DC | PRN

## 2015-12-17 MED ORDER — MIDAZOLAM HCL 2 MG/2ML IJ SOLN
INTRAMUSCULAR | Status: AC
  Administered 2015-12-17: 1 mg
  Filled 2015-12-17: qty 2

## 2015-12-17 MED ORDER — DOCUSATE SODIUM 100 MG PO CAPS: 100.0000 mg | ORAL_CAPSULE | Freq: Two times a day (BID) | ORAL | Status: DC

## 2015-12-17 MED ORDER — PROPOFOL 10 MG/ML IV BOLUS
INTRAVENOUS | Status: DC | PRN
  Administered 2015-12-17: 200 mg via INTRAVENOUS

## 2015-12-17 MED ORDER — OXYCODONE-ACETAMINOPHEN 5-325 MG PO TABS
1.0000 | ORAL_TABLET | ORAL | Status: DC | PRN
  Administered 2015-12-17 – 2015-12-18 (×4): 2 via ORAL
  Filled 2015-12-17 (×4): qty 2

## 2015-12-17 MED ORDER — VITAMIN C 500 MG PO TABS: 500.0000 mg | ORAL_TABLET | Freq: Every day | ORAL | Status: DC

## 2015-12-17 MED ORDER — FENTANYL CITRATE (PF) 100 MCG/2ML IJ SOLN
INTRAMUSCULAR | Status: AC
  Administered 2015-12-17: 50 ug
  Filled 2015-12-17: qty 2

## 2015-12-17 MED ORDER — FENTANYL CITRATE (PF) 250 MCG/5ML IJ SOLN
INTRAMUSCULAR | Status: AC
  Filled 2015-12-17: qty 5

## 2015-12-17 MED ORDER — METHOCARBAMOL 500 MG PO TABS: 500.0000 mg | ORAL_TABLET | Freq: Four times a day (QID) | ORAL | Status: DC

## 2015-12-17 MED ORDER — CLINDAMYCIN PHOSPHATE 600 MG/50ML IV SOLN
600.0000 mg | Freq: Four times a day (QID) | INTRAVENOUS | Status: AC
  Administered 2015-12-17 – 2015-12-18 (×3): 600 mg via INTRAVENOUS
  Filled 2015-12-17 (×4): qty 50

## 2015-12-17 MED ORDER — ONDANSETRON HCL 4 MG/2ML IJ SOLN: 4.0000 mg | Freq: Four times a day (QID) | INTRAMUSCULAR | Status: DC | PRN

## 2015-12-17 MED ORDER — ONDANSETRON HCL 4 MG/2ML IJ SOLN: 4.0000 mg | Freq: Once | INTRAMUSCULAR | Status: DC | PRN

## 2015-12-17 MED ORDER — DIPHENHYDRAMINE HCL 25 MG PO CAPS: 25.0000 mg | ORAL_CAPSULE | Freq: Four times a day (QID) | ORAL | Status: DC | PRN

## 2015-12-17 MED ORDER — ADULT MULTIVITAMIN W/MINERALS CH
1.0000 | ORAL_TABLET | Freq: Every day | ORAL | Status: DC
  Administered 2015-12-17 – 2015-12-19 (×4): 1 via ORAL
  Filled 2015-12-17 (×3): qty 1

## 2015-12-17 MED ORDER — OXYCODONE-ACETAMINOPHEN 10-325 MG PO TABS: 1.0000 | ORAL_TABLET | ORAL | Status: DC | PRN

## 2015-12-17 MED ORDER — METHOCARBAMOL 500 MG PO TABS: 500.0000 mg | ORAL_TABLET | Freq: Four times a day (QID) | ORAL | Status: DC | PRN

## 2015-12-17 MED ORDER — HYDROMORPHONE HCL 1 MG/ML IJ SOLN
0.5000 mg | INTRAMUSCULAR | Status: DC | PRN
  Administered 2015-12-18: 1 mg via INTRAVENOUS
  Filled 2015-12-17 (×2): qty 1

## 2015-12-17 MED ORDER — OXYCODONE HCL 5 MG PO TABS: 5.0000 mg | ORAL_TABLET | Freq: Once | ORAL | Status: DC | PRN

## 2015-12-17 MED ORDER — MIDAZOLAM HCL 2 MG/2ML IJ SOLN
INTRAMUSCULAR | Status: AC
  Filled 2015-12-17: qty 2

## 2015-12-17 MED ORDER — HYDROCODONE-ACETAMINOPHEN 5-325 MG PO TABS: 1.0000 | ORAL_TABLET | ORAL | Status: DC | PRN

## 2015-12-17 MED ORDER — ONDANSETRON HCL 4 MG/2ML IJ SOLN
INTRAMUSCULAR | Status: DC | PRN
  Administered 2015-12-17: 4 mg via INTRAVENOUS

## 2015-12-17 MED ORDER — CEFAZOLIN SODIUM 1-5 GM-% IV SOLN
1.0000 g | Freq: Three times a day (TID) | INTRAVENOUS | Status: DC
  Administered 2015-12-18 – 2015-12-19 (×5): 1 g via INTRAVENOUS
  Filled 2015-12-17 (×7): qty 50

## 2015-12-17 MED ORDER — ALPRAZOLAM 0.5 MG PO TABS: 0.5000 mg | ORAL_TABLET | Freq: Four times a day (QID) | ORAL | Status: DC | PRN

## 2015-12-17 MED ORDER — VITAMIN C 500 MG PO TABS
1000.0000 mg | ORAL_TABLET | Freq: Every day | ORAL | Status: DC
  Administered 2015-12-17 – 2015-12-19 (×4): 1000 mg via ORAL
  Filled 2015-12-17 (×3): qty 2

## 2015-12-17 MED ORDER — LACTATED RINGERS IV SOLN
INTRAVENOUS | Status: DC | PRN
  Administered 2015-12-17: 16:00:00 via INTRAVENOUS

## 2015-12-17 SURGICAL SUPPLY — 75 items
BANDAGE ELASTIC 3 VELCRO ST LF (GAUZE/BANDAGES/DRESSINGS) ×3 IMPLANT
BANDAGE ELASTIC 4 VELCRO ST LF (GAUZE/BANDAGES/DRESSINGS) ×3 IMPLANT
BIT DRILL 2.2 SS TIBIAL (BIT) ×3 IMPLANT
BLADE SURG ROTATE 9660 (MISCELLANEOUS) IMPLANT
BNDG ESMARK 4X9 LF (GAUZE/BANDAGES/DRESSINGS) ×3 IMPLANT
BNDG GAUZE ELAST 4 BULKY (GAUZE/BANDAGES/DRESSINGS) ×3 IMPLANT
CANISTER SUCTION 2500CC (MISCELLANEOUS) ×3 IMPLANT
CORDS BIPOLAR (ELECTRODE) ×3 IMPLANT
COVER SURGICAL LIGHT HANDLE (MISCELLANEOUS) ×3 IMPLANT
CUFF TOURNIQUET SINGLE 18IN (TOURNIQUET CUFF) ×3 IMPLANT
CUFF TOURNIQUET SINGLE 24IN (TOURNIQUET CUFF) IMPLANT
DECANTER SPIKE VIAL GLASS SM (MISCELLANEOUS) ×3 IMPLANT
DRAPE OEC MINIVIEW 54X84 (DRAPES) ×3 IMPLANT
DRAPE SURG 17X11 SM STRL (DRAPES) ×3 IMPLANT
DRAPE SURG 17X23 STRL (DRAPES) ×3 IMPLANT
DRSG ADAPTIC 3X8 NADH LF (GAUZE/BANDAGES/DRESSINGS) ×3 IMPLANT
EVACUATOR 1/8 PVC DRAIN (DRAIN) IMPLANT
GAUZE SPONGE 4X4 12PLY STRL (GAUZE/BANDAGES/DRESSINGS) ×3 IMPLANT
GAUZE SPONGE 4X4 16PLY XRAY LF (GAUZE/BANDAGES/DRESSINGS) ×3 IMPLANT
GAUZE XEROFORM 1X8 LF (GAUZE/BANDAGES/DRESSINGS) ×3 IMPLANT
GLOVE BIOGEL PI IND STRL 8.5 (GLOVE) ×2 IMPLANT
GLOVE BIOGEL PI INDICATOR 8.5 (GLOVE) ×1
GLOVE SURG ORTHO 8.0 STRL STRW (GLOVE) ×3 IMPLANT
GOWN STRL REUS W/ TWL LRG LVL3 (GOWN DISPOSABLE) ×4 IMPLANT
GOWN STRL REUS W/ TWL XL LVL3 (GOWN DISPOSABLE) ×2 IMPLANT
GOWN STRL REUS W/TWL LRG LVL3 (GOWN DISPOSABLE) ×2
GOWN STRL REUS W/TWL XL LVL3 (GOWN DISPOSABLE) ×1
K-WIRE 1.6 (WIRE) ×2
K-WIRE FX5X1.6XNS BN SS (WIRE) ×4
KIT BASIN OR (CUSTOM PROCEDURE TRAY) ×3 IMPLANT
KIT ROOM TURNOVER OR (KITS) ×3 IMPLANT
KWIRE FX5X1.6XNS BN SS (WIRE) ×4 IMPLANT
LOOP VESSEL MAXI BLUE (MISCELLANEOUS) IMPLANT
NEEDLE HYPO 25GX1X1/2 BEV (NEEDLE) IMPLANT
NEEDLE HYPO 25X1 1.5 SAFETY (NEEDLE) ×3 IMPLANT
NS IRRIG 1000ML POUR BTL (IV SOLUTION) ×3 IMPLANT
PACK ORTHO EXTREMITY (CUSTOM PROCEDURE TRAY) ×3 IMPLANT
PAD ARMBOARD 7.5X6 YLW CONV (MISCELLANEOUS) ×6 IMPLANT
PAD CAST 4YDX4 CTTN HI CHSV (CAST SUPPLIES) ×2 IMPLANT
PADDING CAST COTTON 4X4 STRL (CAST SUPPLIES) ×1
PLATE RIGHT VOLAR RIM DVR (Plate) ×3 IMPLANT
SCREW LOCK 16X2.7X 3 LD TPR (Screw) ×8 IMPLANT
SCREW LOCK 18X2.7X 3 LD TPR (Screw) ×2 IMPLANT
SCREW LOCK 22X2.7X 3 LD TPR (Screw) ×2 IMPLANT
SCREW LOCK 24X2.7X3 LD THRD (Screw) ×8 IMPLANT
SCREW LOCK 26X2.7X 3 LD TPR (Screw) ×4 IMPLANT
SCREW LOCKING 2.7X16 (Screw) ×4 IMPLANT
SCREW LOCKING 2.7X18 (Screw) ×1 IMPLANT
SCREW LOCKING 2.7X22MM (Screw) ×1 IMPLANT
SCREW LOCKING 2.7X24MM (Screw) ×4 IMPLANT
SCREW LOCKING 2.7X26MM (Screw) ×2 IMPLANT
SCREW NONLOCK 2.7X30MM (Screw) ×3 IMPLANT
SLING ARM FOAM STRAP LRG (SOFTGOODS) ×3 IMPLANT
SOAP 2 % CHG 4 OZ (WOUND CARE) ×3 IMPLANT
SPLINT FIBERGLASS 3X35 (CAST SUPPLIES) ×3 IMPLANT
SPONGE LAP 4X18 X RAY DECT (DISPOSABLE) ×3 IMPLANT
SUCTION FRAZIER HANDLE 10FR (MISCELLANEOUS)
SUCTION TUBE FRAZIER 10FR DISP (MISCELLANEOUS) IMPLANT
SUT PROLENE 3 0 PS 1 (SUTURE) ×6 IMPLANT
SUT PROLENE 4 0 PS 2 18 (SUTURE) ×6 IMPLANT
SUT PROLENE 6 0 P 1 18 (SUTURE) ×3 IMPLANT
SUT VIC AB 2-0 CT1 27 (SUTURE)
SUT VIC AB 2-0 CT1 TAPERPNT 27 (SUTURE) IMPLANT
SUT VIC AB 2-0 FS1 27 (SUTURE) ×6 IMPLANT
SUT VIC AB 3-0 FS2 27 (SUTURE) IMPLANT
SUT VIC AB 4-0 PS2 27 (SUTURE) ×6 IMPLANT
SUT VICRYL 4-0 PS2 18IN ABS (SUTURE) IMPLANT
SYR CONTROL 10ML LL (SYRINGE) IMPLANT
SYSTEM CHEST DRAIN TLS 7FR (DRAIN) IMPLANT
TOWEL OR 17X24 6PK STRL BLUE (TOWEL DISPOSABLE) ×3 IMPLANT
TOWEL OR 17X26 10 PK STRL BLUE (TOWEL DISPOSABLE) ×3 IMPLANT
TUBE CONNECTING 12X1/4 (SUCTIONS) ×3 IMPLANT
UNDERPAD 30X30 INCONTINENT (UNDERPADS AND DIAPERS) ×3 IMPLANT
WATER STERILE IRR 1000ML POUR (IV SOLUTION) ×3 IMPLANT
YANKAUER SUCT BULB TIP NO VENT (SUCTIONS) IMPLANT

## 2015-12-17 NOTE — Progress Notes (Signed)
PT Cancellation Note  Patient Details Name: Jason CookeyShannon [name] MRN: [id number] DOB: [birth year]   Cancelled Treatment:    Reason Eval/Treat Not Completed: Patient not medically ready. Pt scheduled for OR this afternoon for wrist surgery. Will hold PT eval today and complete evaluation when able post-surgery.   Conni SlipperKirkman, Lamontae Ricardo 12/17/2015, 1:49 PM   Conni SlipperLaura Shelbey Spindler, PT, DPT Acute Rehabilitation Services Pager: [phone]

## 2015-12-17 NOTE — Progress Notes (Signed)
OT Cancellation Note  Patient Details Name: Toy CookeyShannon Coello MRN: [id number] DOB: [birth year]   Cancelled Treatment:    Reason Eval/Treat Not Completed: Pain limiting ability to participate;Other (comment). Pt declined OT eval today stating that he has severe headache and pain in R UE. Pt's nurse notified of request for pain meds and refusal of OT eval. Pt also scheduled to go to OR for wrist surgery this afternoon. Will re attempt when appropriate  Galen ManilaSpencer, Altovise Wahler Jeanette 12/17/2015, 10:58 AM

## 2015-12-17 NOTE — Anesthesia Postprocedure Evaluation (Signed)
Anesthesia Post Note  Patient: Jason [name]  Procedure(s) Performed: Procedure(s) (LRB): OPEN REDUCTION INTERNAL FIXATION (ORIF) RIGHT DISTAL RADIUS FRACTURE (Right) CARPAL TUNNEL RELEASE (Right)  Patient location during evaluation: PACU Anesthesia Type: General Level of consciousness: awake and alert Pain management: pain level controlled Vital Signs Assessment: post-procedure vital signs reviewed and stable Respiratory status: spontaneous breathing, nonlabored ventilation, respiratory function stable and patient connected to nasal cannula oxygen Cardiovascular status: blood pressure returned to baseline and stable Postop Assessment: no signs of nausea or vomiting Anesthetic complications: no    Last Vitals:  Filed Vitals:   12/17/15 1825 12/17/15 1837  BP: 148/88 146/81  Pulse: 65 63  Temp:    Resp: 13 11    Last Pain:  Filed Vitals:   12/17/15 1838  PainSc: 7                  Mihran Lebarron,JAMES TERRILL

## 2015-12-17 NOTE — Progress Notes (Signed)
Pharmacy Antibiotic Note  Jason CookeyShannon [name] is a 50 y.o. male admitted on 12/16/2015 with a R-wrist and shoulder injury and is now s/p ORIF of the R-radial fracture and R-carpal tunnel release.   Pharmacy has been consulted for post-op Clindamycin dosing.  Per the post-op Orthopedic orderset - the usual Clindamycin dosing is 600 mg every 6 hours for 3 doses. Will enter this post-operatively for this patient.   The patient received Clindamycin 900 mg IV x 1 dose at 1600 earlier today  Plan: 1. Clindamycin 600 mg every 6 hours for 3 doses 2. Pharmacy will sign off as no further doses expected at this time.      Temp (24hrs), Avg:98.2 F (36.8 C), Min:97.9 F (36.6 C), Max:98.7 F (37.1 C)   Recent Labs Lab 12/16/15 1822  WBC 8.6  CREATININE 0.82    CrCl cannot be calculated (Unknown ideal weight.).    Allergies  Allergen Reactions  . Penicillins Rash   Thank you for allowing pharmacy to be a part of this patient's care.  Georgina PillionElizabeth Sultana Tierney, PharmD, BCPS Clinical Pharmacist Pager: [phone] 12/17/2015 7:07 PM

## 2015-12-17 NOTE — Progress Notes (Signed)
R/B/A DISCUSSED WITH PT IN HOSPITAL.  PT VOICED UNDERSTANDING OF PLAN CONSENT SIGNED DAY OF SURGERY PT SEEN AND EXAMINED PRIOR TO OPERATIVE PROCEDURE/DAY OF SURGERY SITE MARKED. QUESTIONS ANSWERED WILL BE ADMITTED FOLLOWING SURGERY WE ARE PLANNING SURGERY FOR YOUR UPPER EXTREMITY. THE RISKS AND BENEFITS OF SURGERY INCLUDE BUT NOT LIMITED TO BLEEDING INFECTION, DAMAGE TO NEARBY NERVES ARTERIES TENDONS, FAILURE OF SURGERY TO ACCOMPLISH ITS INTENDED GOALS, PERSISTENT SYMPTOMS AND NEED FOR FURTHER SURGICAL INTERVENTION. WITH THIS IN MIND WE WILL PROCEED. I HAVE DISCUSSED WITH THE PATIENT THE PRE AND POSTOPERATIVE REGIMEN AND THE DOS AND DON'TS. PT VOICED UNDERSTANDING AND INFORMED CONSENT SIGNED. 

## 2015-12-17 NOTE — Brief Op Note (Signed)
12/16/2015 - 12/17/2015  3:55 PM  PATIENT:  Jason [name]  50 y.o. male  PRE-OPERATIVE DIAGNOSIS:  RIGHT RADIUS FRACTURE; RIGHT CARPAL TUNNEL SYNDROME  POST-OPERATIVE DIAGNOSIS:  * No post-op diagnosis entered *  PROCEDURE:  Procedure(s): OPEN REDUCTION INTERNAL FIXATION (ORIF) DISTAL RADIAL FRACTURE (Right) CARPAL TUNNEL RELEASE (Right)  SURGEON:  Surgeon(s) and Role:    * Bradly BienenstockFred Fianna Snowball, MD - Primary  PHYSICIAN ASSISTANT:   ASSISTANTS: none   ANESTHESIA:   general  EBL:  Total I/O In: 0  Out: 800 [Urine:800]  BLOOD ADMINISTERED:none  DRAINS: none   LOCAL MEDICATIONS USED:  NONE  SPECIMEN:  No Specimen  DISPOSITION OF SPECIMEN:  N/A  COUNTS:  YES  TOURNIQUET:    DICTATION: .Other Dictation: Dictation Number [id number]  PLAN OF CARE: Admit to inpatient   PATIENT DISPOSITION:  PACU - hemodynamically stable.   Delay start of Pharmacological VTE agent (>24hrs) due to surgical blood loss or risk of bleeding: not applicable

## 2015-12-17 NOTE — Progress Notes (Signed)
Attempted MRI before pt went to surgery but scanner went down, OR called and wanted pt to come on up, pt sent to OR, per OR MD pt can be done after surgery is done

## 2015-12-17 NOTE — Anesthesia Procedure Notes (Addendum)
Procedure Name: LMA Insertion Date/Time: 12/17/2015 4:11 PM Performed by: Gavin PoundLOWDER, HAL J Pre-anesthesia Checklist: Patient identified, Timeout performed, Emergency Drugs available, Suction available and Patient being monitored Patient Re-evaluated:Patient Re-evaluated prior to inductionOxygen Delivery Method: Circle system utilized Preoxygenation: Pre-oxygenation with 100% oxygen Intubation Type: IV induction Ventilation: Mask ventilation without difficulty LMA: LMA inserted LMA Size: 5.0 Number of attempts: 1 Placement Confirmation: positive ETCO2 and breath sounds checked- equal and bilateral Tube secured with: Tape Dental Injury: Teeth and Oropharynx as per pre-operative assessment    Anesthesia Regional Block:  Supraclavicular block  Pre-Anesthetic Checklist: ,, timeout performed, Correct Patient, Correct Site, Correct Laterality, Correct Procedure, Correct Position, site marked, Risks and benefits discussed,  Surgical consent,  Pre-op evaluation,  At surgeon's request and post-op pain management  Laterality: Right  Prep: chloraprep       Needles:  Injection technique: Single-shot  Needle Type: Echogenic Stimulator Needle     Needle Length: 9cm 9 cm Needle Gauge: 21 and 21 G    Additional Needles:  Procedures: ultrasound guided (picture in chart) Supraclavicular block Narrative:  Start time: 12/17/2015 3:50 PM End time: 12/17/2015 3:55 PM Injection made incrementally with aspirations every 5 mL.  Performed by: Personally   Additional Notes: 30 cc 0.5% Bupivacaine injected easily

## 2015-12-17 NOTE — Progress Notes (Signed)
CRNA at bedside preparing to take pt back to OR. 

## 2015-12-17 NOTE — Progress Notes (Signed)
Betadine nasal wash explained to pt but pt refused, reported, "I am too stressed and I have a headache".

## 2015-12-17 NOTE — Transfer of Care (Signed)
Immediate Anesthesia Transfer of Care Note  Patient: Jason [name]  Procedure(s) Performed: Procedure(s): OPEN REDUCTION INTERNAL FIXATION (ORIF) RIGHT DISTAL RADIUS FRACTURE (Right) CARPAL TUNNEL RELEASE (Right)  Patient Location: PACU  Anesthesia Type:General  Level of Consciousness: awake  Airway & Oxygen Therapy: Patient Spontanous Breathing  Post-op Assessment: Report given to RN and Post -op Vital signs reviewed and stable  Post vital signs: Reviewed and stable  Last Vitals:  Filed Vitals:   12/17/15 1824 12/17/15 1825  BP:  148/88  Pulse:  65  Temp: 36.6 C   Resp:  13    Last Pain:  Filed Vitals:   12/17/15 1827  PainSc: 7       Patients Stated Pain Goal: 3 (12/17/15 [id number])  Complications: No apparent anesthesia complications

## 2015-12-18 ENCOUNTER — Encounter (HOSPITAL_COMMUNITY): Payer: Self-pay | Admitting: Orthopedic Surgery

## 2015-12-18 ENCOUNTER — Inpatient Hospital Stay (HOSPITAL_COMMUNITY): Payer: BLUE CROSS/BLUE SHIELD

## 2015-12-18 MED ORDER — HYDROMORPHONE HCL 2 MG PO TABS: 2.0000 mg | ORAL_TABLET | ORAL | Status: DC | PRN

## 2015-12-18 MED ORDER — HYDROMORPHONE HCL 2 MG PO TABS
2.0000 mg | ORAL_TABLET | ORAL | Status: DC | PRN
  Administered 2015-12-19 (×4): 2 mg via ORAL
  Filled 2015-12-18 (×5): qty 1

## 2015-12-18 NOTE — Op Note (Signed)
Jason [name], BOUCH NO.:  [id number]  MEDICAL RECORD NO.:  [id number]  LOCATION:  [id number]                        FACILITY:  [facility]  PHYSICIAN:  Sharma Covert IV, M.D.DATE OF BIRTH:  04-25-1966  DATE OF PROCEDURE:  12/17/2015 DATE OF DISCHARGE:                              OPERATIVE REPORT   PREOPERATIVE DIAGNOSES: 1. Right wrist comminuted intra-articular distal radius fracture three     or more fragments. 2. Right hand carpal tunnel syndrome.  POSTOPERATIVE DIAGNOSES: 1. Right wrist comminuted intra-articular distal radius fracture three     or more fragments. 2. Right hand carpal tunnel syndrome.  ATTENDING SURGEON:  Sharma Covert, M.D., who was scrubbed and present for the entire procedure.  ASSISTANT SURGEON:  None.  ANESTHESIA:  Supraclavicular block with general anesthesia via LMA.  PROCEDURE: 1.Open treatment right wrist radius fracture, intraarticular of 3 or more fragments with internal fixation 2. Radiograph, 3 views, right wrist.   3.Right wrist brachioradialis tendon release and tenotomy.  TOURNIQUET TIME:  Less than 75 minutes at 250 mmHg.  SURGICAL IMPLANTS:  DVR Crosslock volar rim plate with locking screws distally and locking screws proximally.  RADIOGRAPHIC INTERPRETATION:  AP, lateral and oblique views of the wrist did show the volar plate fixation in place.  There was good alignment of the radiocarpal, inclination, volar tilt and height.  SURGICAL INDICATION:  Jason [name] is a 50 year old gentleman who was skateboarding and fell on an outstretched right wrist, sustaining a highly-comminuted intra-articular distal radius fracture.  The patient was seen and evaluated and recommended to undergo the above procedure. Risks, benefits and alternatives were discussed in detail with the patient and signed informed consent was obtained.  The patient has also had preoperative symptoms of the carpal tunnel and some median  nerve symptoms and carpal tunnel release was performed at the time of the surgery, recommended to perform at the concomitant time.  Risks include, but not limited to bleeding, infection, damage to nearby nerves, arteries, or tendons; loss of motion of the wrist and digits; incomplete relief of symptoms and need for further surgical intervention.  DESCRIPTION OF PROCEDURE:  The patient was properly identified in the preoperative holding area and marked with a permanent marker made on the right hand to indicate the correct operative site.  The patient was brought back to the operating room and placed supine on the anesthesia table where general anesthesia was administered, the patient tolerated this well.  A well-padded tourniquet was placed on the right brachium and sealed with 1000-drape.  The right upper extremity was then prepped and draped in normal sterile fashion.  Time-out was called, the correct side was identified and procedure was then begun.  Attention was then turned to the right wrist.  The several centimeter incision was made in the midpalm.  The limb was then elevated and tourniquet insufflated. Preoperative antibiotics were given.  Incision was made in the midpalm. Dissection was carried down through the skin and subcutaneous tissue. The palmar fascia was incised longitudinally.  Going on the ulnar leaflet of the carpal canal, transverse carpal ligament was incised longitudinally and released under direct visualization distally. Further exposure was then carried out proximally to  the remaining portions of the transverse carpal ligament as well as portion of the antebrachial fascia was then released.  The wound was then thoroughly irrigated.  The patient did have moderate hematoma within the carpal canal.  Wound was irrigated and the skin was then closed using 3-0 Prolene sutures.  There was separate incision.  A longitudinal incision was made directly over the FCR sheath.   Dissection was carried down through the skin and subcutaneous tissue.  The FCR sheath was then opened.  Going through the floor of the FCR sheath, the FPL was then swept out of the way and the pronator quadratus was then elevated.  The pronator quadratus was then elevated in an L-shaped fashion.  Dissection was carried down into the fracture, shaped in order to release the radial column.  The brachioradialis was then carefully released protecting the first dorsal compartment tendons.  After release of the brachioradialis, I was able to reduce the radial columns, this was highly-comminuted intra-articular distal radius fracture three or more fragments.  Following this, open reduction was then performed.  The radial column was then reduced to the central column and the ulnar column with a reduction clamp.  The volar rim plate was then applied. After application of the volar rim plate, it was held with the K-wires. The position was then confirmed using the mini C-arm.  After plate confirmation, the wound was then irrigated and then distal fixation was carried out with the distal locking screw, bicortical engaging the dorsal cortex bringing the plate down.  Following this, position was then confirmed using mini C-arm and distal fixation was carried out from an ulnar to radial direction and with the distal locking screws. Confirmation was then made to make sure that the screws did not penetrate the articular surface.  The patient did have a highly- comminuted injury and small central depression noted.  Following this, fixation was then carried out from an ulnar to radial direction of the highly-comminuted fracture.  This was done with distal locking screws. The nonlocking screw was then replaced with a locking screw.  Final fixation was then carried out in the shaft.  The wound was then irrigated.  Copious wound irrigation done.  Final radiographs were then obtained.  Stress radiography was  then carried out.  There was not appeared to be widening of the FCL interval.  The distal radioulnar joint had good congruency without instability with a small ulnar styloid fracture.  Once this was carried out, the pronator quadratus was then closed with 2-0 Vicryl suture.  The tourniquet was deflated.  The patient did appear to have a sidewall injury to the radial artery. After noting this once the tourniquet was down the radial artery, small vessel clamps were then applied on the radial artery and the radial artery was then repaired with a 6-0 Prolene suture.  There was good flow through the radial artery and a sidewall injury to the radial artery had been repaired with good flow and well-perfused hand.  This was done with a running 6-0 Prolene suture.  Following this, the subcutaneous tissues were then closed with 4-0 Vicryl and the skin loosely reapproximated with 3-0 Prolene.  Adaptic dressing and sterile compressive bandage were then applied.  The patient was then placed in a well-padded sugar-tong splint, extubated and taken to the recovery room in good condition.  POSTPROCEDURAL PLAN:  The patient admitted overnight for IV antibiotics and pain control, discharged when his pain is controlled.  He is up ambulating  with PT, and his shoulder and his pelvis are in comfortable state and plan to see him back in 2 weeks, x-rays, suture removal and application of a short-arm cast for total of 3 weeks, 5 weeks immobilization and begin an outpatient therapy regimen at that point and radiographs at each visit.  PROGNOSIS:  The patient did have a highly-comminuted pilon-type fracture of the distal radius with highly comminution.  There was felt to be good restoration of the anatomical radial height, length and tilt.  The patient did have comminuted segment within the central and the dorsal region of the wrist, but with good stability with a volar plate fixation.     Madelynn Done,  M.D.     FWO/MEDQ  D:  12/17/2015  T:  12/18/2015  Job:  [id number]

## 2015-12-18 NOTE — Progress Notes (Signed)
PT SEEN/EXAMINED DOING BETTER THIS EVENING WILL HOLD ON MRI UNTIL OUTPATIENT PT DID NOT HAVE GOOD EXPERIENCE WITH MRI YESTERDAY PLAN FOR LIKELY D/C HOME IN AM FINGERS WARM WELL PERFUSED ABLE TO FLEX AND EXTEND THUMB LIMITED DIGITAL MOTION

## 2015-12-18 NOTE — Progress Notes (Signed)
Occupational Therapy Evaluation Patient Details Name: Jason [name] MRN: [id number] DOB: 05-Apr-1966 Today's Date: 12/18/2015    History of Present Illness Pt is a 50 y.o male who fell skateboarding, sustained closed right distal radius fracture and glenohumeral dislocation and right rami fractures, now s/p ORIF Rt radial fx and carpal tunnel release. Rami fx being treated nonoperatively. PMH: none on file, pt reports a history of multiple fracture but not specified.    Clinical Impression   PTA, pt independent with mobility and ADL. Pt currently requires min A with mobility and mod A with ADL after max encouragement to participate. Girlfriend presnt for session. Received clarification from Dr. Orlan Leavens regarding NWB RUE, no ROM R shoulder, and sling at all times with the exception of ADL. Pt only able to ambulate short distance due to pain and complaints of dizziness. Feel pt will most likely able to D/C home tomorrow with intermittent S and HHOT and 3 in 1. Will see in am.    Follow Up Recommendations  Home health OT;Supervision/Assistance - 24 hour (initially)    Equipment Recommendations  3 in 1 bedside comode    Recommendations for Other Services       Precautions / Restrictions Precautions Precautions: Fall Required Braces or Orthoses: Sling (Rt shoulder) Restrictions Weight Bearing Restrictions: Yes RUE Weight Bearing: Non weight bearing RLE Weight Bearing: Weight bearing as tolerated      Mobility Bed Mobility Overal bed mobility: Needs Assistance Bed Mobility: Supine to Sit     Supine to sit: Supervision;HOB elevated     General bed mobility comments: using rail with Lt UE to assist to sitting. Reports feeling dizzy with initial sitting EOB.   Transfers Overall transfer level: Needs assistance Equipment used: None Transfers: Sit to/from [employer] Sit to Stand: Min guard Stand pivot transfers: Min assist;+2 safety/equipment       General  transfer comment: Using rail with Lt UE to assist to standing then holding [facility] for assist with stability.     Balance Overall balance assessment: Needs assistance Sitting-balance support: No upper extremity supported Sitting balance-Leahy Scale: Good     Standing balance support: Single extremity supported Standing balance-Leahy Scale: Fair Standing balance comment: mild instability but no gross loss of balance.                             ADL Overall ADL's : Needs assistance/impaired     Grooming: Set up Grooming Details (indicate cue type and reason): although pt asking fiance to wash his face Upper Body Bathing: Moderate assistance;Sitting   Lower Body Bathing: Moderate assistance;Sit to/from stand   Upper Body Dressing : Moderate assistance;Sitting   Lower Body Dressing: Moderate assistance;Sit to/from stand   Toilet Transfer: Minimal assistance;Ambulation (cane)   Toileting- Clothing Manipulation and Hygiene: Set up Toileting - Clothing Manipulation Details (indicate cue type and reason): pt independently using urinal after set up     Functional mobility during ADLs: Minimal assistance;Cane;+2 for safety/equipment General ADL Comments: Pt required max encouragement to participate with mobility     Vision     Perception     Praxis      Pertinent Vitals/Pain Pain Assessment: 0-10 Pain Score: 8  Faces Pain Scale: Hurts whole lot Pain Location: R wrist Pain Descriptors / Indicators: Aching;Grimacing;Guarding;Moaning Pain Intervention(s): Limited activity within patient's tolerance;Repositioned;Ice applied     Hand Dominance Right   Extremity/Trunk Assessment Upper Extremity Assessment Upper Extremity Assessment: RUE deficits/detail  RUE Deficits / Details: minimal movement observed R fingers due to pain. Shoulder not tested due to dislocation RUE Coordination: decreased fine motor;decreased gross motor   Lower Extremity Assessment Lower  Extremity Assessment: Defer to PT evaluation RLE Deficits / Details: Pt able to move Rt LE independently with bed mobility but slow and guarded. Difficulty with hip flexion during ambulation (appears pain related).    Cervical / Trunk Assessment Cervical / Trunk Assessment: Normal   Communication Communication Communication: No difficulties   Cognition Arousal/Alertness: Awake/alert Behavior During Therapy: Agitated (Agitated with request to attempt to mobilize) Overall Cognitive Status: Within Functional Limits for tasks assessed                     General Comments       Exercises Exercises: Other exercises Other Exercises Other Exercises: encouraged frequent digit ROM Other Exercises: use of ice for edeman control   Shoulder Instructions      Home Living Family/patient expects to be discharged to:: Private residence Living Arrangements: Spouse/significant other Available Help at Discharge: Friend(s);Available PRN/intermittently Type of Home: House Home Access: Stairs to enter Enter[employer] CorporationEntrance Stairs-Number of Steps: 3-4 Entrance Stairs-Rails: None Home Layout: One level     Bathroom Shower/Tub: Producer, television/film/videoWalk-in shower   Bathroom Toilet: Standard Bathroom Accessibility: Yes How Accessible: Accessible via walker Home Equipment: None   Additional Comments: fiance to stay with patient      Prior Functioning/Environment Level of Independence: Independent             OT Diagnosis: Generalized weakness;Acute pain   OT Problem List: Decreased strength;Decreased activity tolerance;Decreased range of motion;Decreased coordination;Decreased safety awareness;Decreased knowledge of use of DME or AE;Decreased knowledge of precautions;Impaired UE functional use;Pain;Increased edema   OT Treatment/Interventions: Self-care/ADL training;Therapeutic exercise;DME and/or AE instruction;Therapeutic activities;Patient/family education;Balance training    OT Goals(Current goals can be  found in the care plan section) Acute Rehab OT Goals Patient Stated Goal: have less pain OT Goal Formulation: With patient Time For Goal Achievement: 01/01/16 Potential to Achieve Goals: Good  OT Frequency: Min 2X/week   Barriers to D/C:            Co-evaluation PT/OT/SLP Co-Evaluation/Treatment: Yes Reason for Co-Treatment: For patient/therapist safety;Necessary to address cognition/behavior during functional activity PT goals addressed during session: Mobility/safety with mobility OT goals addressed during session: ADL's and self-care      End of Session Equipment Utilized During Treatment: Gait belt;Other (comment) Gilmer Mor(Cane) Nurse Communication: Mobility status;Weight bearing status;Precautions  Activity Tolerance: Patient limited by pain Patient left: in chair;with call bell/phone within reach;with family/visitor present   Time: [id number] OT Time Calculation (min): 44 min Charges:  OT General Charges $OT Visit: 1 Procedure OT Evaluation $OT Eval Moderate Complexity: 1 Procedure OT Treatments $Self Care/Home Management : 8-22 mins G-Codes:    Vansh Reckart,HILLARY 12/18/2015, 2:47 PM   [facility] Layla Kesling, OTR/L  [phone] 12/18/2015

## 2015-12-18 NOTE — Discharge Instructions (Signed)
KEEP BANDAGE CLEAN AND DRY CALL OFFICE FOR F/U APPT [phone] in 14 days [facility] [phone] KEEP HAND ELEVATED ABOVE HEART OK TO APPLY ICE TO OPERATIVE AREA CONTACT OFFICE IF ANY WORSENING PAIN OR CONCERNS. KEEP HAND ELEVATED ABOVE HEART IF POSSIBLE SLING FOR RIGHT SHOULDER WEIGHT BEAR AS TOLERATED RIGHT LEG

## 2015-12-18 NOTE — Progress Notes (Signed)
OT Cancellation Note  Patient Details Name: Jason [name] MRN: [id number] DOB: [birth year]   Cancelled Treatment:    Reason Eval/Treat Not Completed: Other (comment) Pt has declined x 2 this am. Initially due to pain and then due to anxiety. Have called Dr Orlan Leavensrtman asking about clarification for WBS RUE, possibility of using platform RW, sling use and ROM R shoulder. Will attempt again. Nsg aware.  [facility]  Tashaun Obey, OTR/L  [phone] 12/18/2015 12/18/2015, 10:01 AM

## 2015-12-18 NOTE — Progress Notes (Signed)
7pm  Sent for pt and pt refused to come down for exam, patient stated that he wanted to talk with Md in regards to MRI

## 2015-12-18 NOTE — Discharge Summary (Signed)
Physician Discharge Summary  Patient ID: Jason [name] MRN: [id number] DOB/AGE: [id number] y.o.  Admit date: 12/16/2015 Discharge date: 12/19/2015  Admission Diagnoses: RIGHT RADIUS FRACTURE; RIGHT CARPAL TUNNEL SYNDROME History reviewed. No pertinent past medical history.  Discharge Diagnoses:  Active Problems:   Fracture of right distal radius RIGHT GLENOHUMERAL DISLOCATION RIGHT SUP/INFERIOR RAMI FRACTURES  Surgeries: Procedure(s): OPEN REDUCTION INTERNAL FIXATION (ORIF) RIGHT DISTAL RADIUS FRACTURE CARPAL TUNNEL RELEASE on 12/16/2015 - 12/17/2015    Consultants: Treatment Team:  Sheral Apley, MD  Discharged Condition: Improved  Hospital Course: [name] is an 50 y.o. male who was admitted 12/16/2015 with a chief complaint of Chief Complaint  Patient presents with  . Fall  . Arm Injury  , and found to have a diagnosis of RIGHT RADIUS FRACTURE; RIGHT CARPAL TUNNEL SYNDROME.  They were brought to the operating room on 12/16/2015 - 12/17/2015 and underwent Procedure(s): OPEN REDUCTION INTERNAL FIXATION (ORIF) RIGHT DISTAL RADIUS FRACTURE CARPAL TUNNEL RELEASE.    They were given perioperative antibiotics: Anti-infectives    Start     Dose/Rate Route Frequency Ordered Stop   12/18/15 0015  ceFAZolin (ANCEF) IVPB 1 g/50 mL premix     1 g 100 mL/hr over 30 Minutes Intravenous Every 8 hours 12/17/15 1851     12/17/15 2200  clindamycin (CLEOCIN) IVPB 600 mg     600 mg 100 mL/hr over 30 Minutes Intravenous Every 6 hours 12/17/15 1908 12/18/15 1307   12/17/15 0600  clindamycin (CLEOCIN) IVPB 900 mg     900 mg 100 mL/hr over 30 Minutes Intravenous On call to O.R. 12/16/15 1757 12/17/15 1601   12/17/15 0600  clindamycin (CLEOCIN) IVPB 900 mg  Status:  Discontinued     900 mg 100 mL/hr over 30 Minutes Intravenous On call to O.R. 12/16/15 1757 12/16/15 1806    .  They were given sequential compression devices, early ambulation, and Other (comment)AMBULATION for DVT  prophylaxis.  Recent vital signs: Patient Vitals for the past 24 hrs:  BP Temp Temp src Pulse Resp SpO2  12/18/15 2019 133/71 mmHg 98.7 F (37.1 C) Oral 76 16 92 %  12/18/15 1500 120/64 mmHg 98.4 F (36.9 C) Oral 70 16 99 %  12/18/15 0630 123/69 mmHg 98.6 F (37 C) Oral 68 16 93 %  12/18/15 0031 - - Oral - - 100 %  .  Recent laboratory studies: No results found.  Discharge Medications:     Medication List    STOP taking these medications        ibuprofen 200 MG tablet  Commonly known as:  ADVIL,MOTRIN      TAKE these medications        docusate sodium 100 MG capsule  Commonly known as:  COLACE  Take 1 capsule (100 mg total) by mouth 2 (two) times daily.     METFORMIN HCL PO  Take 1 tablet by mouth 2 (two) times daily.     methocarbamol 500 MG tablet  Commonly known as:  ROBAXIN  Take 1 tablet (500 mg total) by mouth 4 (four) times daily.     oxyCODONE-acetaminophen 10-325 MG tablet  Commonly known as:  PERCOCET  Take 1 tablet by mouth every 4 (four) hours as needed for pain.     vitamin C 500 MG tablet  Commonly known as:  ASCORBIC ACID  Take 1 tablet (500 mg total) by mouth daily.        Diagnostic Studies: Dg Pelvis 1-2 Views  12/16/2015  CLINICAL  DATA:  Pain after 10 foot fall. EXAM: PELVIS - 1-2 VIEW COMPARISON:  None. FINDINGS: There appears to be minimally displaced fractures involving the right inferior and superior pubic rami. Hip and sacroiliac joints appear normal. IMPRESSION: Probable minimally displaced fractures involving the right inferior and superior pubic rami. Electronically Signed   By: Lupita RaiderJames  Green Jr, M.D.   On: 12/16/2015 12:50   Dg Shoulder Right  12/16/2015  CLINICAL DATA:  Postreduction right shoulder anterior dislocation EXAM: RIGHT SHOULDER - 2+ VIEW COMPARISON:  Study obtained earlier in the day FINDINGS: Frontal and Y scapular images obtained. The anterior dislocation noted earlier in the day has been reduced successfully. Currently no  dislocation. No acute fracture evident. Prior fracture of the junction of the mid and lateral thirds of the right clavicle again noted with 3.5 cm of overriding of fracture fragments. There old healed rib fractures on the right as well. Joint spaces appear unremarkable. IMPRESSION: Reduction of anterior shoulder dislocation. Prior fractures of the right clavicle in multiple ribs. No acute fracture or dislocation evident on this current examination. Electronically Signed   By: Bretta BangWilliam  Woodruff III M.D.   On: 12/16/2015 16:21   Dg Shoulder Right  12/16/2015  CLINICAL DATA:  [place]vere right shoulder pain [place] History of right clavicle fracture. EXAM: RIGHT SHOULDER - 2+ VIEW COMPARISON:  None. FINDINGS: There is anterior glenohumeral dislocation without definite associated acute fracture. There is a fracture of the mid right clavicle which remains medially and inferiorly displaced. This may be incompletely healed. The acromioclavicular joint appears intact. There are multiple old right-sided rib fractures. IMPRESSION: 1. Acute anterior dislocation of the glenohumeral joint. No associated acute fracture identified. 2. Mid right clavicle fracture with persistent displacement and possible nonunion. Electronically Signed   By: Carey BullocksWilliam  Veazey M.D.   On: 12/16/2015 12:49   Dg Forearm Right  12/16/2015  CLINICAL DATA:  [place] [employer] [place] and landed onto his right side. Patient is having severe right shoulder pain that radiates to his right elbow. Patient had little mobility of his right arm due to pain. History of clavicle and wrist fracture. EXAM: RIGHT FOREARM - 2 VIEW COMPARISON:  None. FINDINGS: There is an acute intra-articular fracture of the distal radius which is impacted, moderately comminuted and displaced. The displacement is primarily in the radial and dorsal directions. There is and mildly displaced fracture of the ulnar styloid. No definite carpal bone  fractures identified on these views. The elbow is incompletely visualized but grossly intact. IMPRESSION: Displaced and impacted intra-articular fractures of the distal radius and ulna as described. Electronically Signed   By: Carey BullocksWilliam  Veazey M.D.   On: 12/16/2015 12:51   Dg Wrist 2 Views Right  12/16/2015  CLINICAL DATA:  Larey SeatFell, postreduction. Subsequent encounter. EXAM: RIGHT WRIST - 2 VIEW COMPARISON:  Plain films earlier today FINDINGS: Comminuted distal radius fracture and ulnar styloid fracture demonstrating improved position and alignment following closed reduction. Overlying splint. IMPRESSION: Improved appearance status post closed reduction. Electronically Signed   By: Elsie StainJohn T Curnes M.D.   On: 12/16/2015 16:14   Ct Head Wo Contrast  12/16/2015  CLINICAL DATA:  10 foot fall with right arm injury. Initial encounter. EXAM: CT HEAD WITHOUT CONTRAST CT CERVICAL SPINE WITHOUT CONTRAST TECHNIQUE: Multidetector CT imaging of the head and cervical spine was performed following the standard protocol without intravenous contrast. Multiplanar CT image reconstructions of the cervical spine were also generated. COMPARISON:  None.  FINDINGS: Known right glenohumeral dislocation and remote clavicle fracture. CT HEAD FINDINGS Skull and Sinuses:Negative for fracture or destructive process. The visualized mastoids, middle ears, and imaged paranasal sinuses are clear. Visualized orbits: Negative. Brain: Normal. No evidence of acute infarction, hemorrhage, hydrocephalus, or mass lesion/mass effect. CT CERVICAL SPINE FINDINGS Negative for acute fracture or subluxation. No prevertebral edema. No gross cervical canal hematoma. 35 mm (craniocaudal) left thyroid nodule projecting posteriorly. IMPRESSION: 1. No evidence of intracranial or cervical spine injury. 2. Right glenohumeral dislocation. 3. 35 mm left thyroid nodule. Recommend sonography, likely to be followed by biopsy. Electronically Signed   By: Marnee Spring M.D.    On: 12/16/2015 13:11   Ct Cervical Spine Wo Contrast  12/16/2015  CLINICAL DATA:  10 foot fall with right arm injury. Initial encounter. EXAM: CT HEAD WITHOUT CONTRAST CT CERVICAL SPINE WITHOUT CONTRAST TECHNIQUE: Multidetector CT imaging of the head and cervical spine was performed following the standard protocol without intravenous contrast. Multiplanar CT image reconstructions of the cervical spine were also generated. COMPARISON:  None. FINDINGS: Known right glenohumeral dislocation and remote clavicle fracture. CT HEAD FINDINGS Skull and Sinuses:Negative for fracture or destructive process. The visualized mastoids, middle ears, and imaged paranasal sinuses are clear. Visualized orbits: Negative. Brain: Normal. No evidence of acute infarction, hemorrhage, hydrocephalus, or mass lesion/mass effect. CT CERVICAL SPINE FINDINGS Negative for acute fracture or subluxation. No prevertebral edema. No gross cervical canal hematoma. 35 mm (craniocaudal) left thyroid nodule projecting posteriorly. IMPRESSION: 1. No evidence of intracranial or cervical spine injury. 2. Right glenohumeral dislocation. 3. 35 mm left thyroid nodule. Recommend sonography, likely to be followed by biopsy. Electronically Signed   By: Marnee Spring M.D.   On: 12/16/2015 13:11   Dg Chest Port 1 View  12/16/2015  CLINICAL DATA:  [place] [employer] [place] and landed onto his right side. Patient is having severe right shoulder pain that radiates to his right elbow. Patient had little mobility of his right arm due to pain. EXAM: PORTABLE CHEST 1 VIEW COMPARISON:  None. FINDINGS: Two views are submitted. The heart size and mediastinal contours are normal without evidence of mediastinal hematoma. The lungs are clear. There is no pleural effusion or pneumothorax. There are multiple old right-sided rib fractures. There is a fracture of the mid right clavicle which is displaced and possibly incompletely healed. No acute fractures are  identified within the chest. Anterior glenohumeral dislocation is noted on the right. IMPRESSION: No acute chest findings. Right glenohumeral dislocation, old right-sided rib fractures and possible nonunion of mid right clavicle fracture. Electronically Signed   By: Carey Bullocks M.D.   On: 12/16/2015 12:53    They benefited maximally from their hospital stay and there were no complications.     Disposition: Final discharge disposition not confirmed      Follow-up Information    Follow up with Sharma Covert, MD In 2 weeks.   Specialty:  Orthopedic Surgery   Contact information:   [address] [place] [address] [phone]        Signed: Sharma Covert 12/18/2015, 9:43 PM

## 2015-12-18 NOTE — Evaluation (Signed)
Physical Therapy Evaluation Patient Details Name: Jason [name] MRN: [id number] DOB: October 10, 1965 Today's Date: 12/18/2015   History of Present Illness  Pt is a 50 y.o male who fell skateboarding, sustained closed right distal radius fracture and glenohumeral dislocation and right rami fractures, now s/p ORIF Rt radial fx and carpal tunnel release. Rami fx being treated nonoperatively. PMH: none on file, pt reports a history of multiple fracture but not specified.   Clinical Impression  Pt mobilizing slowly during initial PT evaluation and treatment. Consistent encouragement needed for participation and attempt to ambulate. Patient's primary complaint during session was Rt wrist pain. Pt able to bear weight through Rt LE for ambulation (12 ft with SPC) but with decreased stance time. Pt will needing to advance his mobility for anticipated D/C to home, including stairs. PT to continue to progress mobility as tolerated. Fiance reporting that she will be available to assist upon D/C.     Follow Up Recommendations Home health PT;Supervision for mobility/OOB    Equipment Recommendations  Cane    Recommendations for Other Services       Precautions / Restrictions Precautions Precautions: Fall Required Braces or Orthoses: Sling (Rt shoulder) Restrictions Weight Bearing Restrictions: Yes RUE Weight Bearing: Non weight bearing RLE Weight Bearing: Weight bearing as tolerated      Mobility  Bed Mobility Overal bed mobility: Needs Assistance Bed Mobility: Supine to Sit     Supine to sit: Supervision;HOB elevated     General bed mobility comments: using rail with Lt UE to assist to sitting. Reports feeling dizzy with initial sitting EOB.   Transfers Overall transfer level: Needs assistance Equipment used: None Transfers: Sit to/from Stand Sit to Stand: Min guard         General transfer comment: Using rail with Lt UE to assist to standing then [facility] with  stability.   Ambulation/Gait Ambulation/Gait assistance: +2 physical assistance;Min assist;Min guard Ambulation Distance (Feet): 12 Feet Assistive device: Straight cane Gait Pattern/deviations: Decreased weight shift to right;Decreased stance time - right Gait velocity: slow pattern   General Gait Details: Pt with slow pattern but able to bear weight through Rt LE. [facility]. Pt complaining primarily of Rt wrist pain.   Stairs            Wheelchair Mobility    Modified Rankin (Stroke Patients Only)       Balance Overall balance assessment: Needs assistance Sitting-balance support: No upper extremity supported Sitting balance-Leahy Scale: Good     Standing balance support: Single extremity supported Standing balance-Leahy Scale: Fair Standing balance comment: mild instability but no gross loss of balance.                              Pertinent Vitals/Pain Pain Assessment: Faces Faces Pain Scale: Hurts whole lot Pain Location: Rt wrist Pain Descriptors / Indicators: Grimacing;Guarding (reporting that wrist just hurts) Pain Intervention(s): Limited activity within patient's tolerance;Monitored during session    Home Living Family/patient expects to be discharged to:: Private residence Living Arrangements: Spouse/significant other Available Help at Discharge: Friend(s);Available PRN/intermittently Type of Home: House Home Access: Stairs to enter Entrance Stairs-Rails: None Entrance Stairs-Number of Steps: 3-4 Home Layout: One level Home Equipment: None Additional Comments: fiance to stay with patient    Prior Function Level of Independence: Independent               Hand Dominance  Extremity/Trunk Assessment   Upper Extremity Assessment: Defer to OT evaluation           Lower Extremity Assessment: RLE deficits/detail RLE Deficits / Details: Pt able to move Rt LE independently with bed mobility but slow and  guarded. Difficulty with hip flexion during ambulation (appears pain related).        Communication   Communication: No difficulties  Cognition Arousal/Alertness: Awake/alert Behavior During Therapy: Agitated (Agitated with request to attempt to mobilize) Overall Cognitive Status: Within Functional Limits for tasks assessed                      General Comments General comments (skin integrity, edema, etc.): Pt reports feeling dizzy and nauseated with activity, BP 156/90, SpO2 97%. Nursing notified of nausea.     Exercises        Assessment/Plan    PT Assessment Patient needs continued PT services  PT Diagnosis Difficulty walking   PT Problem List Decreased strength;Decreased activity tolerance;Decreased balance;Decreased mobility;Decreased range of motion  PT Treatment Interventions DME instruction;Gait training;Stair training;Functional mobility training;Therapeutic activities;Therapeutic exercise;Balance training;Patient/family education   PT Goals (Current goals can be found in the Care Plan section) Acute Rehab PT Goals Patient Stated Goal: have less pain PT Goal Formulation: With patient Time For Goal Achievement: 01/01/16 Potential to Achieve Goals: Good    Frequency Min 5X/week   Barriers to discharge        Co-evaluation PT/OT/SLP Co-Evaluation/Treatment: Yes Reason for Co-Treatment: For patient/therapist safety PT goals addressed during session: Mobility/safety with mobility         End of Session Equipment Utilized During Treatment: Gait belt Activity Tolerance: Patient limited by pain Patient left: in chair;with call bell/phone within reach;with family/visitor present Nurse Communication: Mobility status;Weight bearing status         Time: 1610-9604 PT Time Calculation (min) (ACUTE ONLY): 32 min   Charges:   PT Evaluation $PT Eval Moderate Complexity: 1 Procedure     PT G Codes:        Christiane Ha, PT, CSCS Pager [phone] Office [phone]  12/18/2015, 11:40 AM

## 2015-12-19 MED ORDER — HYDROMORPHONE HCL 2 MG PO TABS: 2.0000 mg | ORAL_TABLET | ORAL | Status: DC | PRN

## 2015-12-19 NOTE — Progress Notes (Signed)
Physical Therapy Treatment Patient Details Name: Jason [name] MRN: [id number] DOB: Aug 04, 1965 Today's Date: 12/19/2015    History of Present Illness      PT Comments    Patient continues to have significant pain with transfer. His gait pattern improved as he walked furter. He was encouraged to continue walking at home. He declined using steps he will go home using EMS.   Follow Up Recommendations  Home health PT;Supervision for mobility/OOB     Equipment Recommendations  Cane    Recommendations for Other Services       Precautions / Restrictions Precautions Precautions: Fall Required Braces or Orthoses: Sling Restrictions Weight Bearing Restrictions: Yes RUE Weight Bearing: Non weight bearing RLE Weight Bearing: Weight bearing as tolerated    Mobility  Bed Mobility                  Transfers Overall transfer level: Needs assistance Equipment used: Straight cane;Ambulation equipment used (cane) Transfers: Sit to/from Stand Sit to Stand: Mod assist Stand pivot transfers: Min assist       General transfer comment: Several atte,pts required to transfer from sit to stand. Patient states " im not in the right mindframe". He was limited by pain max cuing required to stand.   Ambulation/Gait Ambulation/Gait assistance: Mod assist Ambulation Distance (Feet): 12 Feet Assistive device: Straight cane Gait Pattern/deviations: Decreased step length - right;Decreased stance time - right;Decreased stride length;Step-to pattern         Stairs Stairs:  (Patient declined stairs will return home with EMS )          Wheelchair Mobility    Modified Rankin (Stroke Patients Only)       Balance     Sitting balance-Leahy Scale: Good       Standing balance-Leahy Scale: Fair                      Cognition Arousal/Alertness: Awake/alert Behavior During Therapy: Agitated Overall Cognitive Status: Within Functional Limits for tasks assessed                       Exercises Total Joint Exercises Heel Slides: Limitations Heel Slides Limitations: with sheet patient advised to slide heel and flex knee/hip  Other Exercises Other Exercises: Standing at sink weight shiting x10 fowd/bac and lateral x10; standing hi flexion     General Comments        Pertinent Vitals/Pain Pain Score: 8  Pain Location: right hip  Pain Descriptors / Indicators: Aching Pain Intervention(s): Limited activity within patient's tolerance    Home Living                      Prior Function            PT Goals (current goals can now be found in the care plan section) Acute Rehab PT Goals Patient Stated Goal: have less pain PT Goal Formulation: With patient Time For Goal Achievement: 01/01/16 Potential to Achieve Goals: Good    Frequency  Min 5X/week    PT Plan      Co-evaluation             End of Session           Time: [id number] PT Time Calculation (min) (ACUTE ONLY): 27 min  Charges:  $Therapeutic Exercise: 8-22 mins $Therapeutic Activity: 8-22 mins  G Codes:      Dessie Coma PT DPT 12/19/2015, 3:33 PM

## 2015-12-19 NOTE — Progress Notes (Signed)
Pt ready for discharge. Education/instructions reviewed with pt and significant other, and all questions/concerns addressed. IV removed and belongings gathered. Pt will be transported home via [facility]. Will continue to monitor

## 2015-12-19 NOTE — Progress Notes (Signed)
Occupational Therapy Treatment Patient Details Name: Toy CookeyShannon Krah MRN: [id number] DOB: 15-Apr-1966 Today's Date: 12/19/2015    History of present illness Pt is a 50 y.o male who fell skateboarding, sustained closed right distal radius fracture and glenohumeral dislocation and right rami fractures, now s/p ORIF Rt radial fx and carpal tunnel release. Rami fx being treated nonoperatively. PMH: none on file, pt reports a history of multiple fracture but not specified.    OT comments  Completed education regarding use of AE/DME and compensatory techniques for ADL. Pt having more difficulty with mobility this am. Required Min A to only take @ 5 steps to chair. Encouraged pt to ambulate further distance as plan is for D/C home today. Pt responded " I just can't do it. I don't know how I'm suppose to go home and do this". Discussed mobility concerns with CM and PT. Pt plans to D/C home with ambulance transport. REcommend follow up with HHOT.   Follow Up Recommendations  Home health OT;Supervision/Assistance - 24 hour    Equipment Recommendations  3 in 1 bedside comode    Recommendations for Other Services      Precautions / Restrictions Precautions Precautions: Fall Required Braces or Orthoses: Sling Restrictions RUE Weight Bearing: Non weight bearing RLE Weight Bearing: Weight bearing as tolerated       Mobility Bed Mobility Overal bed mobility: Needs Assistance Bed Mobility: Supine to Sit     Supine to sit: Supervision;HOB elevated     General bed mobility comments: girlfriend states she palns to buy reclienr  Transfers Overall transfer level: Needs assistance Equipment used: Ambulation equipment used (cane) Transfers: Sit to/from [employer] CorporationStand;Stand Pivot Transfers Sit to Stand: Min assist Stand pivot transfers: Min assist       General transfer comment: Increased asstance requried with stand pivot transer today. Pt not wanting to put weight through RLE    Balance      Sitting balance-Leahy Scale: Good       Standing balance-Leahy Scale: Fair                     ADL                                       Functional mobility during ADLs: Minimal assistance;Cane General ADL Comments: Completed eduation regarding use of AE and compensatory techniques for ADL. also educated on use of 3 in1 over toilet and in tub facing out of tub for bathing in order to avoid stepping over tub. Pt/ girlfriend verbalized understanding. Assisted pt with dressing in order to demonstrate how to assist pt. girlfriend was appreciative. Pt with more limited mobility today - only able to take 5 steps to chair. Increased time for all mobility. States he feels something is "roated" in his "pelvis".      Vision                     Perception     Praxis      Cognition   Behavior During Therapy: Agitated Overall Cognitive Status: Within Functional Limits for tasks assessed                       Extremity/Trunk Assessment   Stiff digits. Educated pt on importance of completing ROM frequently to R fingers. Educated on self ROM and elevation as tolerated. Educated on stayin in sling at  all times with the exception of ADL            Exercises   R finger ROM   Shoulder Instructions       General Comments      Pertinent Vitals/ Pain       Pain Assessment: 0-10 Pain Score: 5  Pain Location: R wrist Pain Descriptors / Indicators: Aching Pain Intervention(s): Limited activity within patient's tolerance  Home Living                                          Prior Functioning/Environment              Frequency Min 2X/week     Progress Toward Goals  OT Goals(current goals can now be found in the care plan section)  Progress towards OT goals: Progressing toward goals  Acute Rehab OT Goals Patient Stated Goal: have less pain OT Goal Formulation: With patient Time For Goal Achievement:  01/01/16 Potential to Achieve Goals: Good ADL Goals Pt Will Perform Upper Body Bathing: with supervision;with caregiver independent in assisting;sitting Pt Will Perform Lower Body Bathing: with caregiver independent in assisting;sit to/from stand;with adaptive equipment;with min assist Pt Will Perform Upper Body Dressing: with min assist;with caregiver independent in assisting;sitting Pt Will Perform Lower Body Dressing: with min assist;with caregiver independent in assisting;sit to/from stand;with adaptive equipment Pt Will Transfer to Toilet: with min guard assist;ambulating;bedside commode Pt/caregiver will Perform Home Exercise Program: Right Upper extremity;With written HEP provided;With Supervision  Plan Discharge plan remains appropriate    Co-evaluation                 End of Session Equipment Utilized During Treatment: Gait belt;Other (comment) (cane)   Activity Tolerance Patient limited by pain   Patient Left in chair;with call bell/phone within reach;with family/visitor present   Nurse Communication Mobility status;Patient requests pain meds;Precautions;Weight bearing status        Time: 1610-9604 OT Time Calculation (min): 45 min  Charges: OT General Charges $OT Visit: 1 Procedure OT Treatments $Self Care/Home Management : 38-52 mins  Jair Lindblad,HILLARY 12/19/2015, 1:43 PM   [facility], OTR/L  [phone] 12/19/2015

## 2015-12-19 NOTE — Care Management Note (Signed)
Case Management Note  Patient Details  Name: Jason [name] MRN: [id number] Date of Birth: 1966/01/12  Subjective/Objective:   50 yr old male s/p  ORIF right distal radius fracture  , carpal tunnel release right wrist   Action/Plan:  Case manager spoke with patient and girlfriend concerning home health and DME needs. Choice for home health agencies was offered. Referral was called to [facility], [facility] Liaison. Patient will have support at discharge.   Expected Discharge Date:    12/19/15              Expected Discharge Plan:   Home with Home Health  In-House Referral:     Discharge planning Services     Post Acute Care Choice:    Choice offered to:     DME Arranged:    DME Agency:     HH Arranged:   PT, OT HH Agency:   [facility]  Status of Service:   Completed.  Medicare Important Message Given:    Date Medicare IM Given:    Medicare IM give by:    Date Additional Medicare IM Given:    Additional Medicare Important Message give by:     If discussed at Long Length of Stay Meetings, dates discussed:    Additional Comments:  [name], Jason Min Naomi, RN 12/19/2015, 9:04 AM

## 2015-12-19 NOTE — Progress Notes (Signed)
RN called into room c/o intermittent R shoulder burning/tingling that radiates down arm and to R hand. Patient states "I have not had pain like this before". R radial pulse UTA d/t dressing placement. Good capillary refill. Pt able to purposefully move all 5 fingers and has full sensation in fingers. RN adjusted pt's R arm with pillow support. Pt states "this feels better". RN will continue to assess.

## 2016-01-26 NOTE — Anesthesia Postprocedure Evaluation (Signed)
Anesthesia Post Note  Patient: Jason [name]  Procedure(s) Performed: Procedure(s) (LRB): OPEN REDUCTION INTERNAL FIXATION (ORIF) RIGHT DISTAL RADIUS FRACTURE (Right) CARPAL TUNNEL RELEASE (Right)  Patient location during evaluation: PACU Anesthesia Type: General Level of consciousness: awake, awake and alert and oriented Pain management: pain level controlled Vital Signs Assessment: post-procedure vital signs reviewed and stable Respiratory status: spontaneous breathing, nonlabored ventilation and respiratory function stable Cardiovascular status: blood pressure returned to baseline Anesthetic complications: no    Last Vitals:  Vitals:   12/18/15 2019 12/19/15 0537  BP: 133/71 138/66  Pulse: 76 62  Resp: 16   Temp: 37.1 C 36.7 C    Last Pain:  Vitals:   12/19/15 1331  TempSrc:   PainSc: 7                  Ninette Cotta COKER

## 2016-01-26 NOTE — Anesthesia Preprocedure Evaluation (Addendum)
Anesthesia Evaluation  Patient identified by MRN, date of birth, ID band Patient awake    Reviewed: Allergy & Precautions, NPO status , Patient's Chart, lab work & pertinent test results  Airway Mallampati: II  TM Distance: >3 FB Neck ROM: Full    Dental   Pulmonary    breath sounds clear to auscultation       Cardiovascular  Rhythm:Regular Rate:Normal     Neuro/Psych    GI/Hepatic   Endo/Other    Renal/GU      Musculoskeletal   Abdominal   Peds  Hematology   Anesthesia Other Findings   Reproductive/Obstetrics                            Anesthesia Physical Anesthesia Plan  ASA: III  Anesthesia Plan: General   Post-op Pain Management:    Induction: Intravenous  Airway Management Planned: LMA  Additional Equipment:   Intra-op Plan:   Post-operative Plan:   Informed Consent: I have reviewed the patients History and Physical, chart, labs and discussed the procedure including the risks, benefits and alternatives for the proposed anesthesia with the patient or authorized representative who has indicated his/her understanding and acceptance.   Dental advisory given  Plan Discussed with:   Anesthesia Plan Comments:         Anesthesia Quick Evaluation

## 2017-09-05 ENCOUNTER — Emergency Department (HOSPITAL_COMMUNITY): Payer: BLUE CROSS/BLUE SHIELD

## 2017-09-05 ENCOUNTER — Emergency Department (HOSPITAL_COMMUNITY)
Admission: EM | Admit: 2017-09-05 | Discharge: 2017-09-05 | Disposition: A | Discharge status: Home / Self Care | Payer: BLUE CROSS/BLUE SHIELD | Attending: Emergency Medicine | Admitting: Emergency Medicine

## 2017-09-05 ENCOUNTER — Encounter (HOSPITAL_COMMUNITY): Payer: Self-pay | Admitting: Pharmacy Technician

## 2017-09-05 DIAGNOSIS — Y9351 Activity, roller skating (inline) and skateboarding: Secondary | ICD-10-CM | POA: Diagnosis not present

## 2017-09-05 DIAGNOSIS — S0101XA Laceration without foreign body of scalp, initial encounter: Secondary | ICD-10-CM

## 2017-09-05 DIAGNOSIS — S0990XA Unspecified injury of head, initial encounter: Secondary | ICD-10-CM | POA: Diagnosis present

## 2017-09-05 DIAGNOSIS — M545 Low back pain, unspecified: Secondary | ICD-10-CM

## 2017-09-05 DIAGNOSIS — W19XXXA Unspecified fall, initial encounter: Secondary | ICD-10-CM

## 2017-09-05 DIAGNOSIS — Y999 Unspecified external cause status: Secondary | ICD-10-CM | POA: Diagnosis not present

## 2017-09-05 DIAGNOSIS — Y929 Unspecified place or not applicable: Secondary | ICD-10-CM | POA: Diagnosis not present

## 2017-09-05 MED ORDER — LIDOCAINE-EPINEPHRINE-TETRACAINE (LET) SOLUTION
3.0000 mL | Freq: Once | NASAL | Status: AC
  Administered 2017-09-05: 3 mL via TOPICAL
  Filled 2017-09-05: qty 3

## 2017-09-05 MED ORDER — CYCLOBENZAPRINE HCL 10 MG PO TABS: 10.0000 mg | ORAL_TABLET | Freq: Three times a day (TID) | ORAL | 0 refills | Status: AC | PRN

## 2017-09-05 MED ORDER — LIDOCAINE-EPINEPHRINE (PF) 2 %-1:200000 IJ SOLN
10.0000 mL | Freq: Once | INTRAMUSCULAR | Status: AC
  Administered 2017-09-05: 10 mL
  Filled 2017-09-05: qty 20

## 2017-09-05 MED ORDER — NAPROXEN 375 MG PO TABS: 375.0000 mg | ORAL_TABLET | Freq: Two times a day (BID) | ORAL | 0 refills | Status: AC

## 2017-09-05 NOTE — ED Provider Notes (Signed)
[name] EMERGENCY DEPARTMENT Provider Note   CSN: [id number] Arrival date & time: 09/05/17  1322     History   Chief Complaint Chief Complaint  Patient presents with  . Fall    HPI Jason [name] is a 52 y.o. male.  HPI 52 year old Caucasian male with no pertinent past medical history presents to the emergency department today for evaluation of mechanical fall with head injury and laceration.  Patient states that he was skating on his skateboard when he lost his balance falling backwards landing on his butt and striking the back of his head on the cement.  Patient denies LOC and has been ambulatory since the event.  States that his tetanus shot is up-to-date.  Patient states she has history of concussions and states this feels similar.  Patient with a proximal 4 inch laceration to the back of head with bleeding controlled by EMS.  Patient states he does have some low back pain and buttocks pain from the fall was took the brunt of the injury.  Has been ambulatory since the event.  Denies any lower extremity paresthesias or weakness.  Denies any saddle paresthesias.  Denies any urinary retention, loss of bowel or bladder.  Patient anything for pain prior to arrival.  Nothing makes pain better or worse.  He denies any headache, vision changes, lightheadedness, dizziness.  Does report some ear popping and some pain with opening and closing his jaw that has since resolved when arriving to the ED.  Pt denies any fever, chill, ha, vision changes, lightheadedness, dizziness, congestion, neck pain, cp, sob, cough, abd pain, n/v/d, urinary symptoms, change in bowel habits, melena, hematochezia, lower extremity paresthesias.  History reviewed. No pertinent past medical history.  Patient Active Problem List   Diagnosis Date Noted  . Fracture of right distal radius 12/16/2015    Past Surgical History:  Procedure Laterality Date  . CARPAL TUNNEL RELEASE Right 12/17/2015   Procedure: CARPAL TUNNEL RELEASE;  Surgeon: Bradly Bienenstock, MD;  Location: [facility] OR;  Service: Orthopedics;  Laterality: Right;  . OPEN REDUCTION INTERNAL FIXATION (ORIF) DISTAL RADIAL FRACTURE Right 12/17/2015   Procedure: OPEN REDUCTION INTERNAL FIXATION (ORIF) RIGHT DISTAL RADIUS FRACTURE;  Surgeon: Bradly Bienenstock, MD;  Location: [facility] OR;  Service: Orthopedics;  Laterality: Right;       Home Medications    Prior to Admission medications   Medication Sig Start Date End Date Taking? Authorizing Provider  ibuprofen (ADVIL,MOTRIN) 200 MG tablet Take 400-800 mg by mouth every 6 (six) hours as needed for headache or mild pain.   Yes [provider]  cyclobenzaprine (FLEXERIL) 10 MG tablet Take 1 tablet (10 mg total) by mouth 3 (three) times daily as needed for muscle spasms. 09/05/17   Rise Mu, PA-C  naproxen (NAPROSYN) 375 MG tablet Take 1 tablet (375 mg total) by mouth 2 (two) times daily. 09/05/17   Rise Mu, PA-C    Family History No family history on file.  Social History Social History   Tobacco Use  . Smoking status: Not on file  Substance Use Topics  . Alcohol use: Not on file  . Drug use: Not on file     Allergies   Penicillins   Review of Systems Review of Systems  All other systems reviewed and are negative.    Physical Exam Updated Vital Signs BP 134/71 (BP Location: Right Arm)   Pulse 75   Temp 98.2 F (36.8 C) (Oral)   Resp 14  SpO2 98%   Physical Exam  Constitutional: He is oriented to person, place, and time. He appears well-developed and well-nourished.  Non-toxic appearance. No distress.  HENT:  Head: Normocephalic.  Mouth/Throat: Oropharynx is clear and moist.  No bilateral hemotympanum.  No septal hematoma.  No raccoon eyes or battle sign.  Patient has no skull depression palpated.  He does have approximately 4 similar laceration to the posterior right parietal region.  This is a Y shaped laceration.  Approximately .5 cm deep.   No foreign body appreciated.  Eyes: Conjunctivae are normal. Pupils are equal, round, and reactive to light. Right eye exhibits no discharge. Left eye exhibits no discharge.  Neck: Normal range of motion. Neck supple.  No c spine midline tenderness. No paraspinal tenderness. No deformities or step offs noted. Full ROM. Supple. No nuchal rigidity.    Pulmonary/Chest: Effort normal.  No ecchymosis, crepitus, decreased breath sounds noted.  Abdominal: Soft. Bowel sounds are normal. He exhibits no distension. There is no tenderness. There is no rebound and no guarding.  Musculoskeletal: Normal range of motion. He exhibits no tenderness.  No midline T spine or L spine tenderness. No deformities or step offs noted. Full ROM. Pelvis is stable.  Patient with mild bilateral paraspinal tenderness in the lumbar region.  No ecchymosis noted.   Lymphadenopathy:    He has no cervical adenopathy.  Neurological: He is alert and oriented to person, place, and time.  The patient is alert, attentive, and oriented x 3. Speech is clear. Cranial nerve II-VII grossly intact. Negative pronator drift. Sensation intact. Strength 5/5 in all extremities. Reflexes 2+ and symmetric at biceps, triceps, knees, and ankles. Rapid alternating movement and fine finger movements intact. Romberg is absent. Posture and gait normal.   Skin: Skin is warm and dry. Capillary refill takes less than 2 seconds. No rash noted.  Psychiatric: His behavior is normal. Judgment and thought content normal.  Nursing note and vitals reviewed.    ED Treatments / Results  Labs (all labs ordered are listed, but only abnormal results are displayed) Labs Reviewed - No data to display  EKG  EKG Interpretation None       Radiology Dg Lumbar Spine Complete  Result Date: 09/05/2017 CLINICAL DATA:  Fall today.  Back pain EXAM: LUMBAR SPINE - COMPLETE 4+ VIEW COMPARISON:  None. FINDINGS: There is no evidence of lumbar spine fracture. Alignment  is normal. Intervertebral disc spaces are maintained. IMPRESSION: Negative. Electronically Signed   By: Marlan Palau M.D.   On: 09/05/2017 14:43   Dg Sacrum/coccyx  Result Date: 09/05/2017 CLINICAL DATA:  Fall today. EXAM: SACRUM AND COCCYX - 2+ VIEW COMPARISON:  12/16/2015 FINDINGS: Chronic healed fracture right superior pubis as noted on the prior study. No acute fracture. IMPRESSION: Negative for acute fracture. Electronically Signed   By: Marlan Palau M.D.   On: 09/05/2017 14:44   Ct Head Wo Contrast  Result Date: 09/05/2017 CLINICAL DATA:  52 year old male status post fall from skateboard backwards. Posterior skull injury and left side neck and ear pain radiating between the scapula. EXAM: CT HEAD WITHOUT CONTRAST CT CERVICAL SPINE WITHOUT CONTRAST TECHNIQUE: Multidetector CT imaging of the head and cervical spine was performed following the standard protocol without intravenous contrast. Multiplanar CT image reconstructions of the cervical spine were also generated. COMPARISON:  Head and cervical spine CT 12/16/2015. FINDINGS: CT HEAD FINDINGS Brain: No midline shift, ventriculomegaly, mass effect, evidence of mass lesion, intracranial hemorrhage or evidence of cortically based acute  infarction. Gray-white matter differentiation is within normal limits throughout the brain. Normal cerebral volume. Vascular: No suspicious intracranial vascular hyperdensity. Skull: Stable and intact. Sinuses/Orbits: Remain clear, including the bilateral tympanic cavities and mastoids. Other: Small volume soft tissue gas along the right occipital convexity (series 5, image 49). Larger focus of gas more superiorly along the posterior right parietal bone with nearby hematoma up to 7 millimeters in thickness. The underlying calvarium appears stable and intact. Other scalp soft tissues, and the orbits soft tissues appear normal. Negative visible noncontrast deep soft tissue spaces of the face. CT CERVICAL SPINE FINDINGS  Alignment: Stable with straightening of upper cervical lordosis. Bilateral posterior element alignment is within normal limits. Cervicothoracic junction alignment is within normal limits. Skull base and vertebrae: Visualized skull base is intact. No atlanto-occipital dissociation. No cervical spine fracture. Soft tissues and spinal canal: No prevertebral fluid or swelling. No visible canal hematoma. Left thyromegaly is chronic with a estimated 3.5 centimeter hypodense left lower pole thyroid nodule. Otherwise negative noncontrast neck soft tissues. Disc levels:  Stable, no significant spine degeneration. Upper chest: Negative lung apices. Visible upper thoracic levels appear intact. Chronic right clavicle shaft fracture re-demonstrated on the scout view. IMPRESSION: 1. Posterior scalp soft tissue laceration and hematoma with no underlying skull fracture. 2. Stable and normal noncontrast CT appearance of the brain. 3.  No acute fracture or listhesis in the cervical spine. 4. Chronic left thyroid lower pole 3.5 cm nodule meets consensus criteria for follow-up thyroid ultrasound characterization. Electronically Signed   By: Odessa Fleming M.D.   On: 09/05/2017 14:38   Ct Cervical Spine Wo Contrast  Result Date: 09/05/2017 CLINICAL DATA:  52 year old male status post fall from skateboard backwards. Posterior skull injury and left side neck and ear pain radiating between the scapula. EXAM: CT HEAD WITHOUT CONTRAST CT CERVICAL SPINE WITHOUT CONTRAST TECHNIQUE: Multidetector CT imaging of the head and cervical spine was performed following the standard protocol without intravenous contrast. Multiplanar CT image reconstructions of the cervical spine were also generated. COMPARISON:  Head and cervical spine CT 12/16/2015. FINDINGS: CT HEAD FINDINGS Brain: No midline shift, ventriculomegaly, mass effect, evidence of mass lesion, intracranial hemorrhage or evidence of cortically based acute infarction. Gray-white matter  differentiation is within normal limits throughout the brain. Normal cerebral volume. Vascular: No suspicious intracranial vascular hyperdensity. Skull: Stable and intact. Sinuses/Orbits: Remain clear, including the bilateral tympanic cavities and mastoids. Other: Small volume soft tissue gas along the right occipital convexity (series 5, image 49). Larger focus of gas more superiorly along the posterior right parietal bone with nearby hematoma up to 7 millimeters in thickness. The underlying calvarium appears stable and intact. Other scalp soft tissues, and the orbits soft tissues appear normal. Negative visible noncontrast deep soft tissue spaces of the face. CT CERVICAL SPINE FINDINGS Alignment: Stable with straightening of upper cervical lordosis. Bilateral posterior element alignment is within normal limits. Cervicothoracic junction alignment is within normal limits. Skull base and vertebrae: Visualized skull base is intact. No atlanto-occipital dissociation. No cervical spine fracture. Soft tissues and spinal canal: No prevertebral fluid or swelling. No visible canal hematoma. Left thyromegaly is chronic with a estimated 3.5 centimeter hypodense left lower pole thyroid nodule. Otherwise negative noncontrast neck soft tissues. Disc levels:  Stable, no significant spine degeneration. Upper chest: Negative lung apices. Visible upper thoracic levels appear intact. Chronic right clavicle shaft fracture re-demonstrated on the scout view. IMPRESSION: 1. Posterior scalp soft tissue laceration and hematoma with no underlying skull fracture. 2.  Stable and normal noncontrast CT appearance of the brain. 3.  No acute fracture or listhesis in the cervical spine. 4. Chronic left thyroid lower pole 3.5 cm nodule meets consensus criteria for follow-up thyroid ultrasound characterization. Electronically Signed   By: Odessa Fleming M.D.   On: 09/05/2017 14:38    Procedures .Marland KitchenLaceration Repair Date/Time: 09/05/2017 4:09 PM Performed  by: Rise Mu, PA-C Authorized by: Rise Mu, PA-C   Consent:    Consent obtained:  Verbal   Consent given by:  Patient   Risks discussed:  Infection, need for additional repair, nerve damage, poor wound healing, poor cosmetic result, pain, retained foreign body, tendon damage and vascular damage   Alternatives discussed:  No treatment Anesthesia (see MAR for exact dosages):    Anesthesia method:  Topical application and local infiltration   Topical anesthetic:  LET   Local anesthetic:  Lidocaine 1% WITH epi Laceration details:    Location:  Scalp   Scalp location:  R parietal   Length (cm):  4   Depth (mm):  0.5 Repair type:    Repair type:  Simple Pre-procedure details:    Preparation:  Patient was prepped and draped in usual sterile fashion and imaging obtained to evaluate for foreign bodies Exploration:    Hemostasis achieved with:  Direct pressure   Wound exploration: wound explored through full range of motion and entire depth of wound probed and visualized     Wound extent: no foreign bodies/material noted and no underlying fracture noted     Contaminated: no   Treatment:    Area cleansed with:  Betadine and saline   Amount of cleaning:  Standard   Irrigation solution:  Sterile saline   Irrigation volume:  50   Irrigation method:  Pressure wash   Visualized foreign bodies/material removed: no   Skin repair:    Repair method:  Staples   Number of staples:  9 Approximation:    Approximation:  Close   Vermilion border: well-aligned   Post-procedure details:    Dressing:  Open (no dressing)   Patient tolerance of procedure:  Tolerated well, no immediate complications   (including critical care time)  Medications Ordered in ED Medications  lidocaine-EPINEPHrine (XYLOCAINE W/EPI) 2 %-1:200000 (PF) injection 10 mL (not administered)  lidocaine-EPINEPHrine-tetracaine (LET) solution (3 mLs Topical Given 09/05/17 1458)     Initial Impression /  Assessment and Plan / ED Course  I have reviewed the triage vital signs and the nursing notes.  Pertinent labs & imaging results that were available during my care of the patient were reviewed by me and considered in my medical decision making (see chart for details).     Patient presents to the ED after mechanical fall off of a skateboard.  Patient struck the back of his head on the cement causing a 4 similar laceration.  Patient reports some low back pain.  Denies LOC.  Patient overall well-appearing and nontoxic.  Vital signs are reassuring.  Patient has no focal neuro deficit.  No midline C-spine, T-spine or L-spine tenderness.  Patient has no signs of intrathoracic, intra-abdominal trauma.  CT imaging of head and neck was obtained that shows no acute findings except for the laceration to the posterior region of the scalp.  Patient does have a chronic thyroid nodule that he is aware of but has not followed up with her primary care doctor concerning it.  Lumbar and sacral x-rays revealed no acute fractures.  Laceration was cleaned and repaired.  Staples were placed.  Discussed wound care and follow-up for removal of 5-7 days.  Discussed concussion symptoms and follow-up with concussion clinic if these persist.  Have given him very strict return precautions.  Will give muscle relaxers and anti-inflammatories for pain.   Pt is hemodynamically stable, in NAD, & able to ambulate in the ED. Evaluation does not show pathology that would require ongoing emergent intervention or inpatient treatment. I explained the diagnosis to the patient. Pain has been managed & has no complaints prior to dc. Pt is comfortable with above plan and is stable for discharge at this time. All questions were answered prior to disposition. Strict return precautions for f/u to the ED were discussed. Encouraged follow up with PCP.   Final Clinical Impressions([name]) / ED Diagnoses   Final diagnoses:  Fall, initial encounter    Laceration of scalp, initial encounter  Acute bilateral low back pain without sciatica    ED Discharge Orders        Ordered    cyclobenzaprine (FLEXERIL) 10 MG tablet  3 times daily PRN     09/05/17 1601    naproxen (NAPROSYN) 375 MG tablet  2 times daily     09/05/17 1601       Rise MuLeaphart, Tarita Deshmukh T, PA-C 09/05/17 1614    Vanetta MuldersZackowski, Scott, MD 09/06/17 2233

## 2017-09-05 NOTE — ED Notes (Signed)
IV removed, vitals done

## 2017-09-05 NOTE — ED Notes (Signed)
Patient transported to CT 

## 2017-09-05 NOTE — ED Notes (Signed)
Suture cart at bedside 

## 2017-09-05 NOTE — Discharge Instructions (Signed)
Your imaging was normal.  No signs of head bleed or fractured bones.  You may have a concussion. Concussion Hotline [phone]).  If you develop any worsening symptoms including double vision, blurry vision, vomiting, severe headaches, lightheadedness, dizziness return the ED for evaluation.  Motrin and Tylenol for pain at home.  Please take the Naproxen as prescribed for pain. Do not take any additional NSAIDs including Motrin, Aleve, Ibuprofen, Advil.  Please the the flexeril for muscle relaxation. This medication will make you drowsy so avoid situation that could place you in danger.   Apply ice to help with swelling.  Apply heat to lower back.  Warm soaks and Epsom salt.  WOUND CARE Please have your stitches/staples removed in 5-7 days or sooner if you have concerns. You may do this at any available urgent care or at your primary care doctor's office.  Keep area clean and dry for 24 hours. Do not remove bandage, if applied.  After 24 hours, remove bandage and wash wound gently with mild soap and warm water. Reapply a new bandage after cleaning wound, if directed.  Continue daily cleansing with soap and water until stitches/staples are removed.  Do not apply any ointments or creams to the wound while stitches/staples are in place, as this may cause delayed healing.  Seek medical careif you experience any of the following signs of infection: Swelling, redness, pus drainage, streaking, fever >101.0 F  Seek care if you experience excessive bleeding that does not stop after 15-20 minutes of constant, firm pressure.

## 2017-09-05 NOTE — ED Triage Notes (Signed)
[place]. Hit head on cement, denies LOC, not on blood thinners. Hx of concussion, states this feels the same. Pt with approx 4 inch lac to back of head. Bleeding controlled. Pt with no other complaints from this fall. VSS with EMS. A&OX4.

## 2017-11-19 ENCOUNTER — Emergency Department (HOSPITAL_COMMUNITY): Payer: BLUE CROSS/BLUE SHIELD

## 2017-11-19 ENCOUNTER — Emergency Department (HOSPITAL_COMMUNITY)
Admission: EM | Admit: 2017-11-19 | Discharge: 2017-11-19 | Disposition: A | Discharge status: Home / Self Care | Payer: BLUE CROSS/BLUE SHIELD | Attending: Emergency Medicine | Admitting: Emergency Medicine

## 2017-11-19 ENCOUNTER — Encounter (HOSPITAL_COMMUNITY): Payer: Self-pay | Admitting: Emergency Medicine

## 2017-11-19 ENCOUNTER — Other Ambulatory Visit: Payer: Self-pay

## 2017-11-19 DIAGNOSIS — W19XXXA Unspecified fall, initial encounter: Secondary | ICD-10-CM

## 2017-11-19 DIAGNOSIS — R042 Hemoptysis: Secondary | ICD-10-CM | POA: Insufficient documentation

## 2017-11-19 LAB — CBC WITH DIFFERENTIAL/PLATELET
ABS IMMATURE GRANULOCYTES: 0 10*3/uL (ref 0.0–0.1)
BASOS PCT: 0 %
Basophils Absolute: 0 10*3/uL (ref 0.0–0.1)
Eosinophils Absolute: 0.1 10*3/uL (ref 0.0–0.7)
Eosinophils Relative: 1 %
HCT: 46.7 % (ref 39.0–52.0)
HEMOGLOBIN: 15.7 g/dL (ref 13.0–17.0)
IMMATURE GRANULOCYTES: 0 %
LYMPHS PCT: 12 %
Lymphs Abs: 1.3 10*3/uL (ref 0.7–4.0)
MCH: 29 pg (ref 26.0–34.0)
MCHC: 33.6 g/dL (ref 30.0–36.0)
MCV: 86.3 fL (ref 78.0–100.0)
Monocytes Absolute: 0.6 10*3/uL (ref 0.1–1.0)
Monocytes Relative: 6 %
NEUTROS ABS: 8.2 10*3/uL — AB (ref 1.7–7.7)
NEUTROS PCT: 81 %
PLATELETS: 250 10*3/uL (ref 150–400)
RBC: 5.41 MIL/uL (ref 4.22–5.81)
RDW: 12.6 % (ref 11.5–15.5)
WBC: 10.1 10*3/uL (ref 4.0–10.5)

## 2017-11-19 LAB — BASIC METABOLIC PANEL
ANION GAP: 8 (ref 5–15)
BUN: 11 mg/dL (ref 6–20)
CALCIUM: 9.5 mg/dL (ref 8.9–10.3)
CHLORIDE: 107 mmol/L (ref 101–111)
CO2: 26 mmol/L (ref 22–32)
Creatinine, Ser: 0.85 mg/dL (ref 0.61–1.24)
GFR calc non Af Amer: >60 mL/min (ref >60)
Glucose, Bld: 107 mg/dL — ABNORMAL HIGH (ref 65–99)
POTASSIUM: 4.6 mmol/L (ref 3.5–5.1)
Sodium: 141 mmol/L (ref 135–145)

## 2017-11-19 LAB — URINALYSIS, ROUTINE W REFLEX MICROSCOPIC
Bilirubin Urine: NEGATIVE
GLUCOSE, UA: NEGATIVE mg/dL
Hgb urine dipstick: NEGATIVE
KETONES UR: NEGATIVE mg/dL
LEUKOCYTES UA: NEGATIVE
NITRITE: NEGATIVE
PH: 8 (ref 5.0–8.0)
Protein, ur: NEGATIVE mg/dL
SPECIFIC GRAVITY, URINE: 1.002 — AB (ref 1.005–1.030)

## 2017-11-19 MED ORDER — LIDOCAINE 5 % EX PTCH: 1.0000 | MEDICATED_PATCH | CUTANEOUS | 0 refills | Status: AC

## 2017-11-19 MED ORDER — METHOCARBAMOL 500 MG PO TABS: 500.0000 mg | ORAL_TABLET | Freq: Two times a day (BID) | ORAL | 0 refills | Status: AC

## 2017-11-19 NOTE — ED Notes (Signed)
Pt hooked up to BP and pulse ox and given warm blankets.

## 2017-11-19 NOTE — Discharge Instructions (Addendum)
There were no acute abnormalities on the x-rays or labs.  Expect your soreness to increase over the next 2-3 days. Take it easy, but do not lay around too much as this may make any stiffness worse.  Antiinflammatory medications: Take 600 mg of ibuprofen every 6 hours or 440 mg (over the counter dose) to 500 mg (prescription dose) of naproxen every 12 hours for the next 3 days. After this time, these medications may be used as needed for pain. Take these medications with food to avoid upset stomach. Choose only one of these medications, do not take them together.  Tylenol: Should you continue to have additional pain while taking the ibuprofen or naproxen, you may add in tylenol as needed. Your daily total maximum amount of tylenol from all sources should be limited to /day for persons without liver problems, or /day for those with liver problems. Muscle relaxer: Robaxin is a muscle relaxer and may help loosen stiff muscles. Do not take the Robaxin while driving or performing other dangerous activities.  Lidocaine patches: These are available via either prescription or over-the-counter. The over-the-counter option may be more economical one and are likely just as effective. There are multiple over-the-counter brands, such as Salonpas. Exercises: Be sure to perform the attached exercises starting with three times a week and working up to performing them daily. This is an essential part of preventing long term problems.   Follow up with a primary care provider for any future management of these complaints.

## 2017-11-19 NOTE — ED Triage Notes (Signed)
Pt. Stated, I was skateboard and fell on my left side. I fell on my left side, hip, and back. Pt. Concerned about blood when I cough.

## 2017-11-19 NOTE — ED Provider Notes (Signed)
[facility] EMERGENCY DEPARTMENT Provider Note   CSN: [id number] Arrival date & time: 11/19/17  1026     History   Chief Complaint Chief Complaint  Patient presents with  . Fall  . Abrasion    back, leg and arm  . Hemoptysis    HPI Jason [name] is a 52 y.o. male.  HPI   Jason [name] is a 52 y.o. male, patient with no pertinent past medical history, presenting to the ED with injuries from a fall that occurred around 9 AM this morning.  Patient states he was skating and fell backward, landing on his left side.  Patient was able to get up immediately, had no difficulty walking, and went home.  He states he then began to cough and noted some blood in his sputum. At first it was "just tiny spots in the spit," but he states he had one instance with a dime-sized spot of blood. This scared him so he came to the ED.  Also complains of minor left posterior hip pain.  Denies head injury, LOC, N/V, shortness of breath, chest pain, acute neck pain, back pain, hematuria, weakness, numbness, or any other complaints.   History reviewed. No pertinent past medical history.  Patient Active Problem List   Diagnosis Date Noted  . Fracture of right distal radius 12/16/2015    Past Surgical History:  Procedure Laterality Date  . CARPAL TUNNEL RELEASE Right 12/17/2015   Procedure: CARPAL TUNNEL RELEASE;  Surgeon: Bradly Bienenstock, MD;  Location: [facility] OR;  Service: Orthopedics;  Laterality: Right;  . OPEN REDUCTION INTERNAL FIXATION (ORIF) DISTAL RADIAL FRACTURE Right 12/17/2015   Procedure: OPEN REDUCTION INTERNAL FIXATION (ORIF) RIGHT DISTAL RADIUS FRACTURE;  Surgeon: Bradly Bienenstock, MD;  Location: [facility] OR;  Service: Orthopedics;  Laterality: Right;        Home Medications    Prior to Admission medications   Medication Sig Start Date End Date Taking? Authorizing Provider  ibuprofen (ADVIL,MOTRIN) 200 MG tablet Take 400-800 mg by mouth every 6 (six) hours as needed for headache  or mild pain.   Yes [provider]  cyclobenzaprine (FLEXERIL) 10 MG tablet Take 1 tablet (10 mg total) by mouth 3 (three) times daily as needed for muscle spasms. Patient not taking: Reported on 11/19/2017 09/05/17   Demetrios Loll T, PA-C  lidocaine (LIDODERM) 5 % Place 1 patch onto the skin daily. Remove & Discard patch within 12 hours or as directed by MD 11/19/17   Joy, Shawn C, PA-C  methocarbamol (ROBAXIN) 500 MG tablet Take 1 tablet (500 mg total) by mouth 2 (two) times daily. 11/19/17   Joy, Shawn C, PA-C  naproxen (NAPROSYN) 375 MG tablet Take 1 tablet (375 mg total) by mouth 2 (two) times daily. Patient not taking: Reported on 11/19/2017 09/05/17   Wallace Keller    Family History History reviewed. No pertinent family history.  Social History Social History   Tobacco Use  . Smoking status: Never Smoker  . Smokeless tobacco: Never Used  Substance Use Topics  . Alcohol use: Yes  . Drug use: Not Currently     Allergies   Penicillins   Review of Systems Review of Systems  Respiratory: Negative for shortness of breath.        Hemoptysis  Cardiovascular: Negative for chest pain.  Gastrointestinal: Negative for abdominal pain, diarrhea, nausea and vomiting.  Genitourinary: Negative for hematuria.  Musculoskeletal: Negative for back pain and neck pain.  Skin: Positive for wound.  Neurological: Negative for dizziness, syncope, weakness, light-headedness and numbness.  All other systems reviewed and are negative.    Physical Exam Updated Vital Signs BP 130/88 (BP Location: Right Arm)   Pulse 65   Temp 99.6 F (37.6 C) (Oral)   Resp 12   Ht  (1.88 m)   Wt 96.6 kg (213 lb)   SpO2 96%   BMI 27.35 kg/m   Physical Exam  Constitutional: He appears well-developed and well-nourished. No distress.  HENT:  Head: Normocephalic and atraumatic.  Mouth/Throat: Oropharynx is clear and moist.  Eyes: Pupils are equal, round, and reactive to light.  Conjunctivae and EOM are normal.  Neck: Normal range of motion. Neck supple.  Cardiovascular: Normal rate, regular rhythm, normal heart sounds and intact distal pulses.  Pulmonary/Chest: Effort normal and breath sounds normal. No respiratory distress.  Patient shows no increased work of breathing.  Speaks in full sentences without difficulty.  No coughing noted throughout interview.  No tenderness, wounds, bruising,, swelling, or other abnormality noted to the chest wall.  Abdominal: Soft. There is no tenderness. There is no guarding.  Musculoskeletal: He exhibits no edema.       Back:  Abrasions to the left thoracic and lumbar back.  No significant tenderness throughout this region.  No noted swelling, bruising, crepitus, deformity, or instability.  Full range of motion through the cardinal directions of each of the major joints of the upper and lower extremities without difficulty or pain.  Normal motor function intact in all extremities and spine. No midline spinal tenderness.   Neurological: He is alert.  No sensory deficits.  No noted speech deficits. No aphasia. Patient handles oral secretions without difficulty. No noted swallowing defects.  Equal grip strength bilaterally. Strength 5/5 in the upper extremities. Strength 5/5 with flexion of the hips, knees, and ankles bilaterally.  No gait disturbance.  Coordination intact including heel to shin and finger to nose.  Cranial nerves III-XII grossly intact.  No facial droop.   Skin: Skin is warm and dry. He is not diaphoretic.  Psychiatric: He has a normal mood and affect. His behavior is normal.  Nursing note and vitals reviewed.      ED Treatments / Results  Labs (all labs ordered are listed, but only abnormal results are displayed) Labs Reviewed  URINALYSIS, ROUTINE W REFLEX MICROSCOPIC - Abnormal; Notable for the following components:      Result Value   Color, Urine STRAW (*)    Specific Gravity, Urine 1.002 (*)     All other components within normal limits  BASIC METABOLIC PANEL - Abnormal; Notable for the following components:   Glucose, Bld 107 (*)    All other components within normal limits  CBC WITH DIFFERENTIAL/PLATELET - Abnormal; Notable for the following components:   Neutro Abs 8.2 (*)    All other components within normal limits    EKG None  Radiology Dg Chest 2 View  Result Date: 11/19/2017 CLINICAL DATA:  Larey Seat off skateboard injury left side. EXAM: CHEST - 2 VIEW COMPARISON:  12/16/2015 FINDINGS: Lungs are adequately inflated without consolidation, effusion or pneumothorax. Cardiomediastinal silhouette is within normal. Old right mid clavicular fracture and multiple old posterior right rib fractures. IMPRESSION: No acute findings. Electronically Signed   By: Elberta Fortis M.D.   On: 11/19/2017 14:04   Dg Ribs Unilateral Left  Result Date: 11/19/2017 CLINICAL DATA:  Larey Seat off skateboard injuring left side. EXAM: LEFT RIBS - 2 VIEW COMPARISON:  Chest x-ray 12/16/2015  FINDINGS: No fracture or other bone lesions are seen involving the ribs. IMPRESSION: Negative. Electronically Signed   By: Elberta Fortis M.D.   On: 11/19/2017 14:07   Dg Hip Unilat W Or Wo Pelvis 2-3 Views Left  Result Date: 11/19/2017 CLINICAL DATA:  Larey Seat off skateboard injury left-side with hip pain. EXAM: DG HIP (WITH OR WITHOUT PELVIS) 2-3V LEFT COMPARISON:  None. FINDINGS: There is no evidence of hip fracture or dislocation. There is no evidence of arthropathy or other focal bone abnormality. IMPRESSION: Negative. Electronically Signed   By: Elberta Fortis M.D.   On: 11/19/2017 14:07    Procedures Procedures (including critical care time)  Medications Ordered in ED Medications - No data to display   Initial Impression / Assessment and Plan / ED Course  I have reviewed the triage vital signs and the nursing notes.  Pertinent labs & imaging results that were available during my care of the patient were reviewed by me  and considered in my medical decision making (see chart for details).     Patient presents for evaluation following a fall with an additional complaint of hemoptysis. Patient is nontoxic appearing, afebrile, not tachycardic, not tachypneic, not hypotensive, maintains adequate SPO2 on room air, and is in no apparent distress.  No increased work of breathing. No coughing or hemoptysis during ED course.  X-rays without acute abnormality.  Lab results reassuring. The patient was given instructions for home care as well as return precautions. Patient voices understanding of these instructions, accepts the plan, and is comfortable with discharge.   Vitals:   11/19/17 1047 11/19/17 1058 11/19/17 1101 11/19/17 1613  BP:  130/88  134/90  Pulse:  65  68  Resp:  12  16  Temp:  99.6 F (37.6 C)    TempSrc:  Oral    SpO2:  96%  97%  Weight: 96.6 kg (213 lb)  96.6 kg (213 lb)   Height:  (1.88 m)   (1.88 m)      Final Clinical Impressions(s) / ED Diagnoses   Final diagnoses:  Hemoptysis  Fall, initial encounter    ED Discharge Orders        Ordered    methocarbamol (ROBAXIN) 500 MG tablet  2 times daily     11/19/17 1533    lidocaine (LIDODERM) 5 %  Every 24 hours     11/19/17 1533       Anselm Pancoast, PA-C 11/19/17 1622    Loren Racer, MD 11/23/17 1314

## 2018-09-18 ENCOUNTER — Other Ambulatory Visit: Payer: Self-pay | Admitting: Orthopedic Surgery

## 2018-09-18 DIAGNOSIS — R531 Weakness: Secondary | ICD-10-CM

## 2018-09-18 DIAGNOSIS — R609 Edema, unspecified: Secondary | ICD-10-CM

## 2018-09-18 DIAGNOSIS — R52 Pain, unspecified: Secondary | ICD-10-CM

## 2018-10-01 ENCOUNTER — Other Ambulatory Visit: Payer: BLUE CROSS/BLUE SHIELD

## 2018-11-22 ENCOUNTER — Ambulatory Visit
Admission: RE | Admit: 2018-11-22 | Discharge: 2018-11-22 | Disposition: A | Discharge status: Home / Self Care | Payer: BLUE CROSS/BLUE SHIELD | Source: Ambulatory Visit | Attending: Orthopedic Surgery | Admitting: Orthopedic Surgery

## 2018-11-22 ENCOUNTER — Other Ambulatory Visit: Payer: Self-pay

## 2018-11-22 DIAGNOSIS — R609 Edema, unspecified: Secondary | ICD-10-CM

## 2018-11-22 DIAGNOSIS — R52 Pain, unspecified: Secondary | ICD-10-CM

## 2018-11-22 DIAGNOSIS — R531 Weakness: Secondary | ICD-10-CM

## 2019-11-05 IMAGING — MR MRI OF THE RIGHT KNEE WITHOUT CONTRAST
4 of 6 series · 23 of 40 positions shown · non-contrast
Comparison: None.

CLINICAL DATA: Chronic knee pain.

EXAM:
MRI OF THE RIGHT KNEE WITHOUT CONTRAST
TECHNIQUE: Multiplanar, multisequence MR imaging of the knee was performed. No
intravenous contrast was administered.

[Series 4: T2 fat-sat · coronal · 4.0mm · 0.59mm/px · 6 of 27 slices shown (1 of 2)]
[im 1/27]
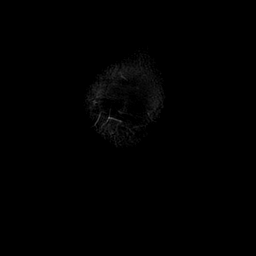
[im 6/27]
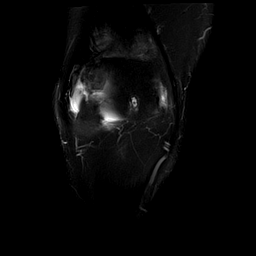
[im 11/27]
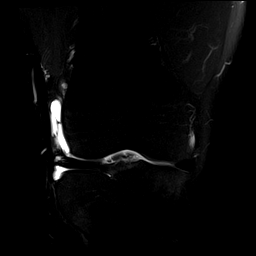
[im 16/27]
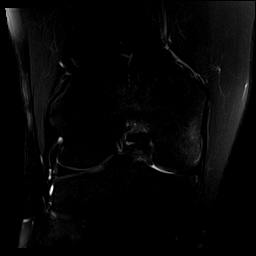
[im 21/27]
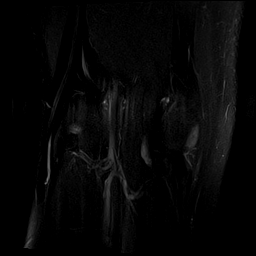
[im 27/27]
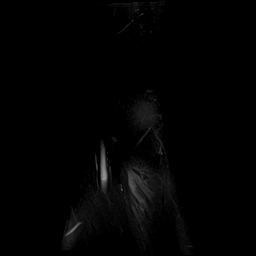

[Series 5: T1 · coronal · 4.0mm · 0.31mm/px · 4 of 27 slices shown]
[im 1/27]
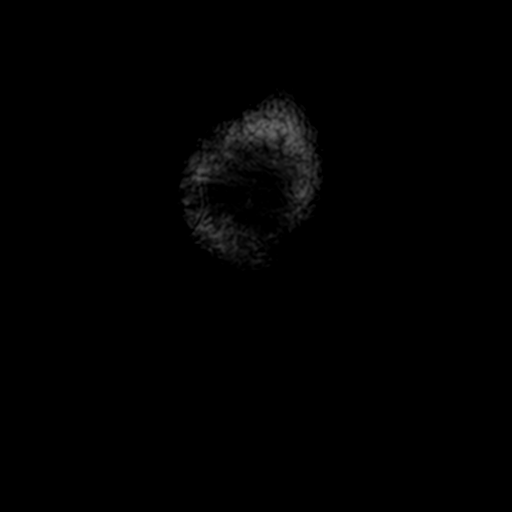
[im 6/27]
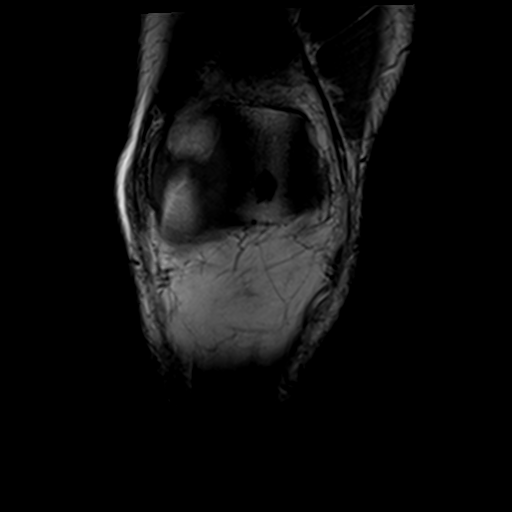
[im 16/27]
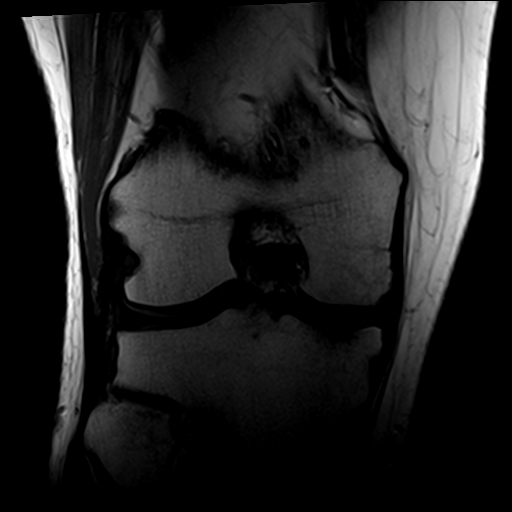
[im 27/27]
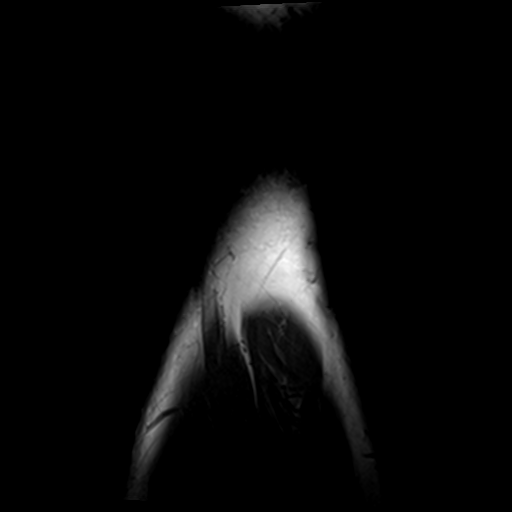

[Series 7: T2 fat-sat · sagittal · 3.0mm · 0.31mm/px · 6 of 28 slices shown (2 of 2)]
[im 1/28]
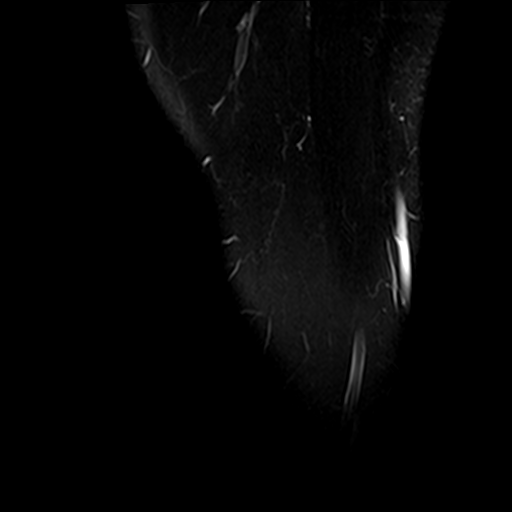
[im 6/28]
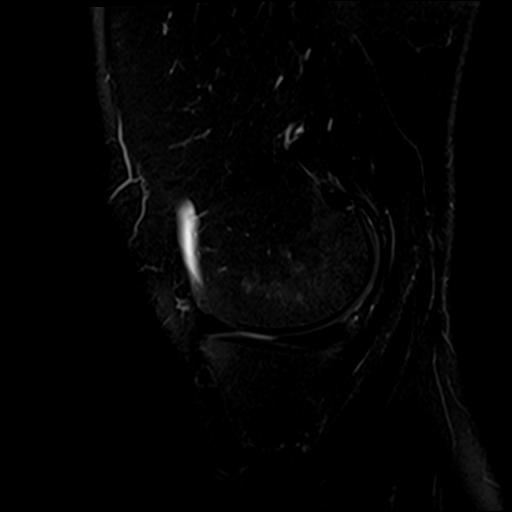
[im 11/28]
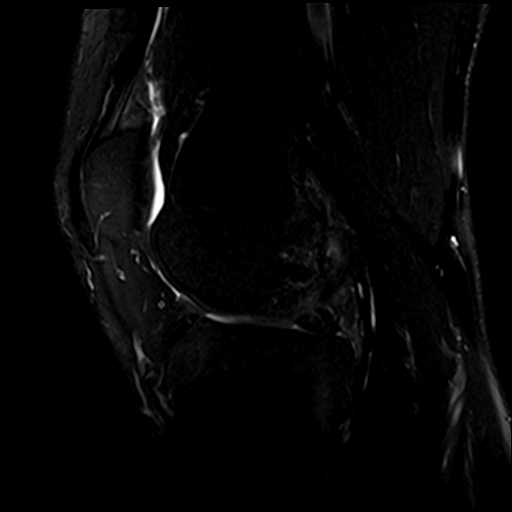
[im 17/28]
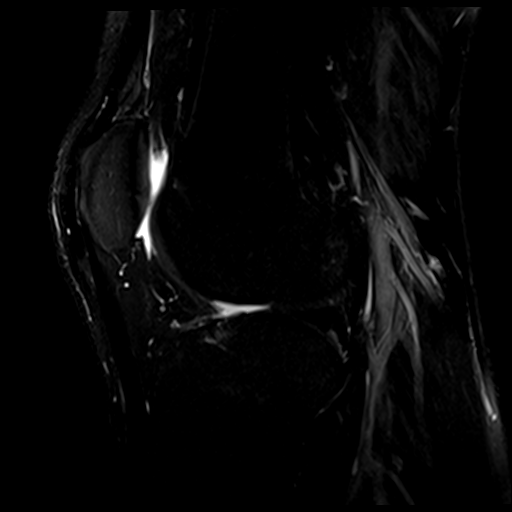
[im 22/28]
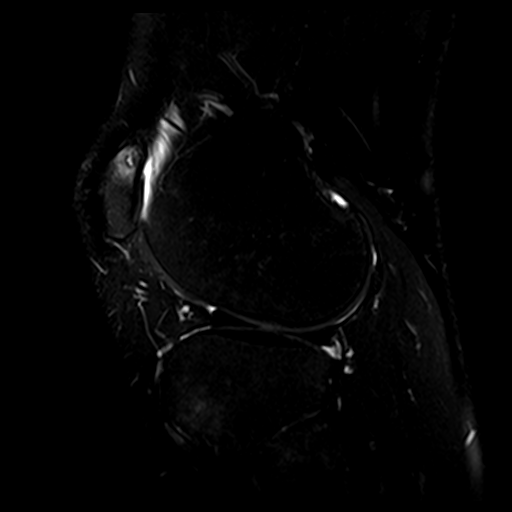
[im 28/28]
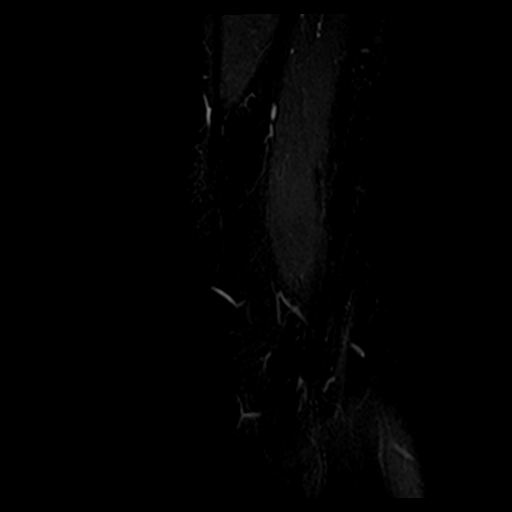

[Series 8: PD fat-sat · sagittal · 3.0mm · 0.31mm/px · 7 of 29 slices shown]
[im 1/29]
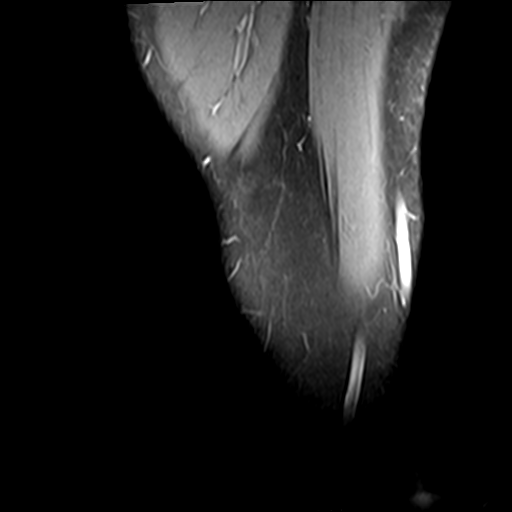
[im 5/29]
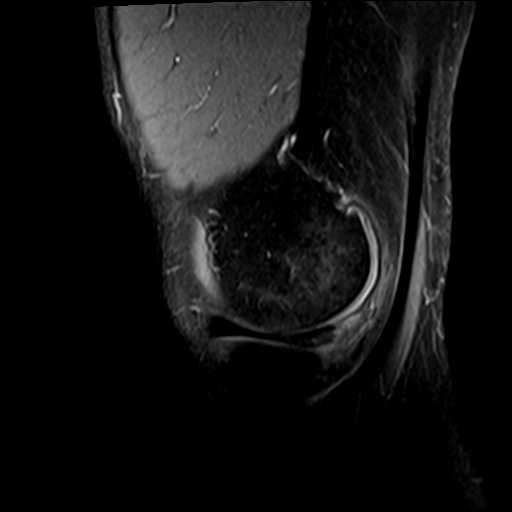
[im 10/29]
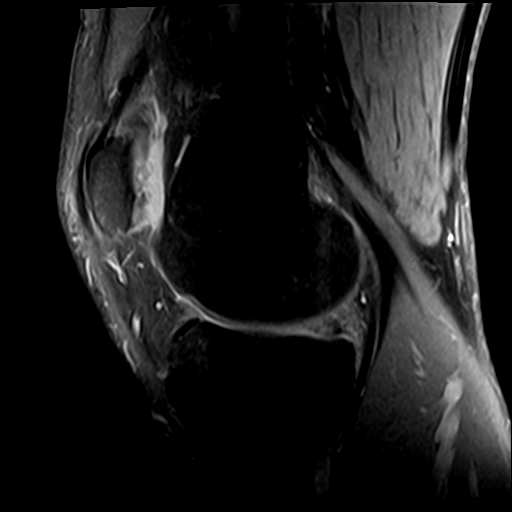
[im 15/29]
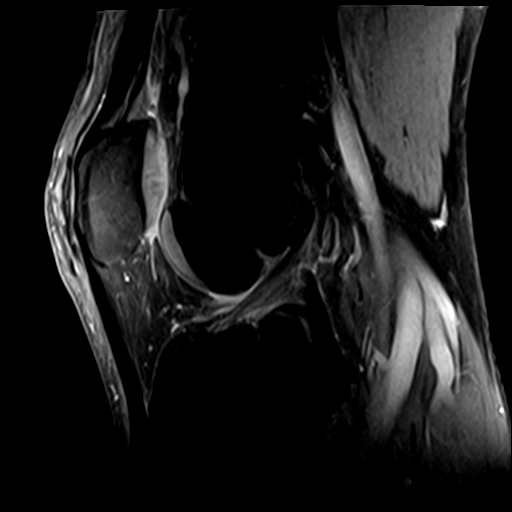
[im 19/29]
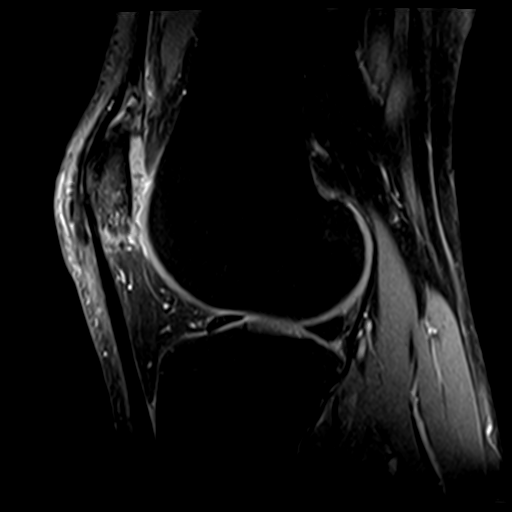
[im 24/29]
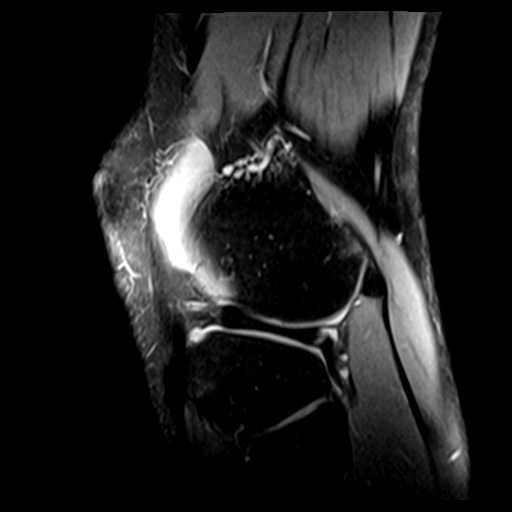
[im 29/29]
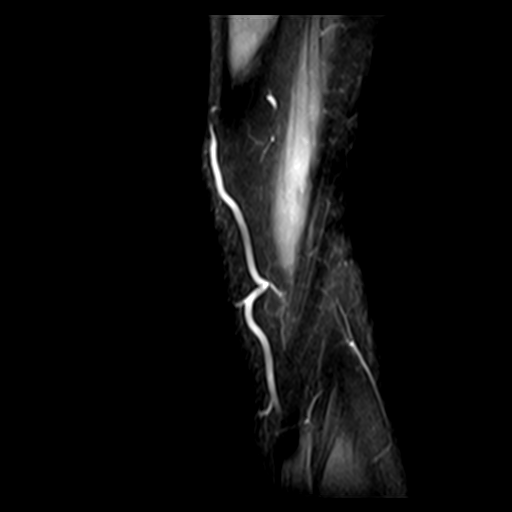

[23 of 40 positions shown; findings below may reference images not displayed]

FINDINGS: MENISCI

Medial meniscus: Complex tear of the posterior horn with radial and
horizontal components.

Lateral meniscus:  Intact.

LIGAMENTS

Cruciates:  Intact ACL and PCL.

Collaterals: Medial collateral ligament is intact. Lateral
collateral ligament complex is intact.

CARTILAGE

Patellofemoral: Fissuring over the patellar apex with tiny
subchondral cyst. Focal high-grade partial-thickness cartilage loss
over the peripheral medial and lateral patellar facets.

Medial:  No chondral defect.

Lateral:  No chondral defect.

Joint: Trace joint effusion. Normal Hoffa's fat. No plical
thickening.

Popliteal Fossa:  Tiny Baker cyst.  Intact popliteus tendon

Extensor Mechanism: Intact quadriceps tendon and patellar tendon.
Intact medial and lateral patellar retinaculum. Intact MPFL.

Bones: Type 2 bipartite patella versus old longitudinal fracture. No
acute fracture or dislocation. Mild marrow edema in the peripheral
medial femoral condyle. No focal bone lesion.

Other: None.
IMPRESSION: 1. Complex tear of the medial meniscus posterior horn.
2. Type 2 bipartite patella versus old longitudinal fracture. No
acute osseous abnormality.
3. Patellar cartilage abnormalities as described above.

## 2020-02-06 ENCOUNTER — Other Ambulatory Visit: Payer: Self-pay | Admitting: Anesthesiology

## 2020-02-06 DIAGNOSIS — M47812 Spondylosis without myelopathy or radiculopathy, cervical region: Secondary | ICD-10-CM

## 2021-01-18 ENCOUNTER — Ambulatory Visit: Payer: BLUE CROSS/BLUE SHIELD | Admitting: Cardiovascular Disease

## 2022-12-29 ENCOUNTER — Other Ambulatory Visit: Payer: Self-pay | Admitting: Internal Medicine

## 2022-12-29 DIAGNOSIS — E042 Nontoxic multinodular goiter: Secondary | ICD-10-CM

## 2023-01-25 ENCOUNTER — Other Ambulatory Visit (HOSPITAL_COMMUNITY)
Admission: RE | Admit: 2023-01-25 | Discharge: 2023-01-25 | Disposition: A | Discharge status: Home / Self Care | Payer: 59 | Source: Ambulatory Visit | Attending: Internal Medicine | Admitting: Internal Medicine

## 2023-01-25 ENCOUNTER — Ambulatory Visit
Admission: RE | Admit: 2023-01-25 | Discharge: 2023-01-25 | Disposition: A | Discharge status: Home / Self Care | Payer: BLUE CROSS/BLUE SHIELD | Source: Ambulatory Visit | Attending: Internal Medicine | Admitting: Internal Medicine

## 2023-01-25 DIAGNOSIS — E041 Nontoxic single thyroid nodule: Secondary | ICD-10-CM | POA: Diagnosis present

## 2023-01-25 DIAGNOSIS — E042 Nontoxic multinodular goiter: Secondary | ICD-10-CM

## 2023-01-25 NOTE — Procedures (Signed)
Interventional Radiology Procedure Note  Procedure:   US guided FNA of 2 left thyroid nodules based on numbering/imaging from [facility].  Left #2+#7 = 1 nodule (TR3) Left #8   Complications: None Recommendations:  - Ok to shower - Do not submerge for 7 days    Signed,  Yvone Neu. Loreta Ave, DO

## 2023-02-08 ENCOUNTER — Encounter (HOSPITAL_COMMUNITY): Payer: Self-pay

## 2023-05-03 ENCOUNTER — Ambulatory Visit: Payer: 59 | Admitting: Psychology

## 2023-05-03 DIAGNOSIS — F064 Anxiety disorder due to known physiological condition: Secondary | ICD-10-CM

## 2023-05-03 DIAGNOSIS — F331 Major depressive disorder, recurrent, moderate: Secondary | ICD-10-CM | POA: Diagnosis not present
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
## 2023-05-08 ENCOUNTER — Ambulatory Visit (INDEPENDENT_AMBULATORY_CARE_PROVIDER_SITE_OTHER): Payer: 59 | Admitting: Psychology

## 2023-05-08 DIAGNOSIS — F419 Anxiety disorder, unspecified: Secondary | ICD-10-CM | POA: Diagnosis not present

## 2023-05-08 DIAGNOSIS — F331 Major depressive disorder, recurrent, moderate: Secondary | ICD-10-CM | POA: Diagnosis not present
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
## 2023-05-17 ENCOUNTER — Ambulatory Visit (INDEPENDENT_AMBULATORY_CARE_PROVIDER_SITE_OTHER): Payer: 59 | Admitting: Psychology

## 2023-05-17 DIAGNOSIS — F064 Anxiety disorder due to known physiological condition: Secondary | ICD-10-CM

## 2023-05-17 DIAGNOSIS — F419 Anxiety disorder, unspecified: Secondary | ICD-10-CM | POA: Diagnosis not present

## 2023-05-17 DIAGNOSIS — F331 Major depressive disorder, recurrent, moderate: Secondary | ICD-10-CM
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
## 2023-06-07 ENCOUNTER — Ambulatory Visit: Payer: 59 | Admitting: Psychology

## 2023-06-07 DIAGNOSIS — F331 Major depressive disorder, recurrent, moderate: Secondary | ICD-10-CM

## 2023-06-07 DIAGNOSIS — F419 Anxiety disorder, unspecified: Secondary | ICD-10-CM | POA: Diagnosis not present
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
## 2023-06-14 ENCOUNTER — Ambulatory Visit: Payer: 59 | Admitting: Psychology

## 2023-06-14 DIAGNOSIS — F411 Generalized anxiety disorder: Secondary | ICD-10-CM

## 2023-06-14 DIAGNOSIS — F331 Major depressive disorder, recurrent, moderate: Secondary | ICD-10-CM
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
## 2023-06-22 ENCOUNTER — Ambulatory Visit: Payer: 59 | Admitting: Psychology

## 2023-06-22 DIAGNOSIS — F419 Anxiety disorder, unspecified: Secondary | ICD-10-CM | POA: Diagnosis not present

## 2023-06-22 DIAGNOSIS — F331 Major depressive disorder, recurrent, moderate: Secondary | ICD-10-CM | POA: Diagnosis not present
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
## 2023-07-06 ENCOUNTER — Ambulatory Visit: Payer: 59 | Admitting: Psychology

## 2023-07-20 ENCOUNTER — Ambulatory Visit: Payer: 59 | Admitting: Psychology

## 2023-07-20 DIAGNOSIS — F064 Anxiety disorder due to known physiological condition: Secondary | ICD-10-CM

## 2023-07-20 DIAGNOSIS — F331 Major depressive disorder, recurrent, moderate: Secondary | ICD-10-CM | POA: Diagnosis not present
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
## 2023-07-26 ENCOUNTER — Ambulatory Visit: Payer: 59 | Admitting: Psychology

## 2023-07-26 DIAGNOSIS — F331 Major depressive disorder, recurrent, moderate: Secondary | ICD-10-CM

## 2023-07-26 DIAGNOSIS — F419 Anxiety disorder, unspecified: Secondary | ICD-10-CM
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
## 2023-08-02 ENCOUNTER — Ambulatory Visit: Payer: 59 | Admitting: Psychology

## 2023-08-02 DIAGNOSIS — F331 Major depressive disorder, recurrent, moderate: Secondary | ICD-10-CM | POA: Diagnosis not present

## 2023-08-02 DIAGNOSIS — F419 Anxiety disorder, unspecified: Secondary | ICD-10-CM | POA: Diagnosis not present
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
## 2023-08-03 ENCOUNTER — Ambulatory Visit: Payer: 59 | Admitting: Psychology

## 2023-08-09 ENCOUNTER — Ambulatory Visit: Payer: 59 | Admitting: Psychology

## 2023-08-16 ENCOUNTER — Ambulatory Visit: Payer: 59 | Admitting: Psychology

## 2023-08-16 DIAGNOSIS — F331 Major depressive disorder, recurrent, moderate: Secondary | ICD-10-CM

## 2023-08-16 DIAGNOSIS — F419 Anxiety disorder, unspecified: Secondary | ICD-10-CM
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
## 2023-08-23 ENCOUNTER — Ambulatory Visit: Payer: 59 | Admitting: Psychology

## 2023-08-23 DIAGNOSIS — F411 Generalized anxiety disorder: Secondary | ICD-10-CM

## 2023-08-23 DIAGNOSIS — F331 Major depressive disorder, recurrent, moderate: Secondary | ICD-10-CM
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
## 2023-08-30 ENCOUNTER — Ambulatory Visit: Payer: 59 | Admitting: Psychology

## 2023-08-30 DIAGNOSIS — F331 Major depressive disorder, recurrent, moderate: Secondary | ICD-10-CM | POA: Diagnosis not present

## 2023-08-30 DIAGNOSIS — F419 Anxiety disorder, unspecified: Secondary | ICD-10-CM
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
## 2023-09-06 ENCOUNTER — Ambulatory Visit: Payer: 59 | Admitting: Psychology

## 2023-09-06 DIAGNOSIS — F331 Major depressive disorder, recurrent, moderate: Secondary | ICD-10-CM

## 2023-09-06 DIAGNOSIS — F419 Anxiety disorder, unspecified: Secondary | ICD-10-CM | POA: Diagnosis not present
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
## 2023-09-13 ENCOUNTER — Ambulatory Visit: Payer: 59 | Admitting: Psychology

## 2023-09-14 ENCOUNTER — Ambulatory Visit: Admitting: Psychology

## 2023-09-20 ENCOUNTER — Ambulatory Visit: Payer: 59 | Admitting: Psychology

## 2023-09-27 ENCOUNTER — Ambulatory Visit (INDEPENDENT_AMBULATORY_CARE_PROVIDER_SITE_OTHER): Payer: 59 | Admitting: Psychology

## 2023-09-27 DIAGNOSIS — F331 Major depressive disorder, recurrent, moderate: Secondary | ICD-10-CM | POA: Diagnosis not present

## 2023-09-27 DIAGNOSIS — F419 Anxiety disorder, unspecified: Secondary | ICD-10-CM | POA: Diagnosis not present
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
## 2023-10-04 ENCOUNTER — Ambulatory Visit: Payer: 59 | Admitting: Psychology

## 2023-10-04 DIAGNOSIS — F330 Major depressive disorder, recurrent, mild: Secondary | ICD-10-CM | POA: Diagnosis not present

## 2023-10-04 DIAGNOSIS — F419 Anxiety disorder, unspecified: Secondary | ICD-10-CM
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
## 2023-10-11 ENCOUNTER — Ambulatory Visit: Admitting: Psychology

## 2023-10-11 ENCOUNTER — Ambulatory Visit: Payer: 59 | Admitting: Psychology

## 2023-10-11 DIAGNOSIS — F331 Major depressive disorder, recurrent, moderate: Secondary | ICD-10-CM | POA: Diagnosis not present

## 2023-10-11 DIAGNOSIS — F419 Anxiety disorder, unspecified: Secondary | ICD-10-CM

## 2023-10-11 DIAGNOSIS — F330 Major depressive disorder, recurrent, mild: Secondary | ICD-10-CM
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
## 2023-10-18 ENCOUNTER — Ambulatory Visit: Payer: 59 | Admitting: Psychology

## 2023-10-19 ENCOUNTER — Ambulatory Visit: Admitting: Psychology

## 2023-10-25 ENCOUNTER — Ambulatory Visit (INDEPENDENT_AMBULATORY_CARE_PROVIDER_SITE_OTHER): Payer: 59 | Admitting: Psychology

## 2023-10-25 DIAGNOSIS — F330 Major depressive disorder, recurrent, mild: Secondary | ICD-10-CM

## 2023-10-25 DIAGNOSIS — F411 Generalized anxiety disorder: Secondary | ICD-10-CM

## 2023-10-25 DIAGNOSIS — F900 Attention-deficit hyperactivity disorder, predominantly inattentive type: Secondary | ICD-10-CM | POA: Diagnosis not present
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
## 2023-11-01 ENCOUNTER — Ambulatory Visit: Payer: 59 | Admitting: Psychology

## 2023-11-01 DIAGNOSIS — F900 Attention-deficit hyperactivity disorder, predominantly inattentive type: Secondary | ICD-10-CM

## 2023-11-01 DIAGNOSIS — F419 Anxiety disorder, unspecified: Secondary | ICD-10-CM

## 2023-11-01 DIAGNOSIS — F330 Major depressive disorder, recurrent, mild: Secondary | ICD-10-CM
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
## 2023-11-08 ENCOUNTER — Ambulatory Visit (INDEPENDENT_AMBULATORY_CARE_PROVIDER_SITE_OTHER): Payer: 59 | Admitting: Psychology

## 2023-11-08 DIAGNOSIS — F900 Attention-deficit hyperactivity disorder, predominantly inattentive type: Secondary | ICD-10-CM

## 2023-11-08 DIAGNOSIS — F330 Major depressive disorder, recurrent, mild: Secondary | ICD-10-CM

## 2023-11-08 DIAGNOSIS — F411 Generalized anxiety disorder: Secondary | ICD-10-CM
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
## 2023-11-15 ENCOUNTER — Ambulatory Visit (INDEPENDENT_AMBULATORY_CARE_PROVIDER_SITE_OTHER): Payer: 59 | Admitting: Psychology

## 2023-11-15 DIAGNOSIS — F900 Attention-deficit hyperactivity disorder, predominantly inattentive type: Secondary | ICD-10-CM | POA: Diagnosis not present

## 2023-11-15 DIAGNOSIS — F330 Major depressive disorder, recurrent, mild: Secondary | ICD-10-CM

## 2023-11-15 DIAGNOSIS — F419 Anxiety disorder, unspecified: Secondary | ICD-10-CM | POA: Diagnosis not present
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
## 2023-11-22 ENCOUNTER — Ambulatory Visit (INDEPENDENT_AMBULATORY_CARE_PROVIDER_SITE_OTHER): Payer: 59 | Admitting: Psychology

## 2023-11-22 DIAGNOSIS — F900 Attention-deficit hyperactivity disorder, predominantly inattentive type: Secondary | ICD-10-CM | POA: Diagnosis not present

## 2023-11-22 DIAGNOSIS — F419 Anxiety disorder, unspecified: Secondary | ICD-10-CM

## 2023-11-22 DIAGNOSIS — F330 Major depressive disorder, recurrent, mild: Secondary | ICD-10-CM | POA: Diagnosis not present
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
## 2023-11-29 ENCOUNTER — Ambulatory Visit (INDEPENDENT_AMBULATORY_CARE_PROVIDER_SITE_OTHER): Payer: 59 | Admitting: Psychology

## 2023-11-29 DIAGNOSIS — F419 Anxiety disorder, unspecified: Secondary | ICD-10-CM | POA: Diagnosis not present

## 2023-11-29 DIAGNOSIS — F900 Attention-deficit hyperactivity disorder, predominantly inattentive type: Secondary | ICD-10-CM

## 2023-11-29 DIAGNOSIS — F330 Major depressive disorder, recurrent, mild: Secondary | ICD-10-CM | POA: Diagnosis not present
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
## 2023-12-06 ENCOUNTER — Ambulatory Visit (INDEPENDENT_AMBULATORY_CARE_PROVIDER_SITE_OTHER): Admitting: Psychology

## 2023-12-06 DIAGNOSIS — F419 Anxiety disorder, unspecified: Secondary | ICD-10-CM

## 2023-12-06 DIAGNOSIS — F330 Major depressive disorder, recurrent, mild: Secondary | ICD-10-CM | POA: Diagnosis not present

## 2023-12-06 DIAGNOSIS — F900 Attention-deficit hyperactivity disorder, predominantly inattentive type: Secondary | ICD-10-CM | POA: Diagnosis not present
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
## 2023-12-13 ENCOUNTER — Ambulatory Visit (INDEPENDENT_AMBULATORY_CARE_PROVIDER_SITE_OTHER): Admitting: Psychology

## 2023-12-13 DIAGNOSIS — F330 Major depressive disorder, recurrent, mild: Secondary | ICD-10-CM

## 2023-12-13 DIAGNOSIS — F411 Generalized anxiety disorder: Secondary | ICD-10-CM

## 2023-12-13 DIAGNOSIS — F900 Attention-deficit hyperactivity disorder, predominantly inattentive type: Secondary | ICD-10-CM
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
## 2023-12-20 ENCOUNTER — Ambulatory Visit: Admitting: Psychology

## 2023-12-27 ENCOUNTER — Ambulatory Visit (INDEPENDENT_AMBULATORY_CARE_PROVIDER_SITE_OTHER): Admitting: Psychology

## 2023-12-27 DIAGNOSIS — F330 Major depressive disorder, recurrent, mild: Secondary | ICD-10-CM

## 2023-12-27 DIAGNOSIS — F900 Attention-deficit hyperactivity disorder, predominantly inattentive type: Secondary | ICD-10-CM

## 2023-12-27 DIAGNOSIS — F419 Anxiety disorder, unspecified: Secondary | ICD-10-CM
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
## 2024-01-03 ENCOUNTER — Ambulatory Visit: Admitting: Psychology

## 2024-01-10 ENCOUNTER — Ambulatory Visit (INDEPENDENT_AMBULATORY_CARE_PROVIDER_SITE_OTHER): Admitting: Psychology

## 2024-01-10 DIAGNOSIS — F419 Anxiety disorder, unspecified: Secondary | ICD-10-CM | POA: Diagnosis not present

## 2024-01-10 DIAGNOSIS — F330 Major depressive disorder, recurrent, mild: Secondary | ICD-10-CM

## 2024-01-10 DIAGNOSIS — F411 Generalized anxiety disorder: Secondary | ICD-10-CM

## 2024-01-10 DIAGNOSIS — F900 Attention-deficit hyperactivity disorder, predominantly inattentive type: Secondary | ICD-10-CM
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
## 2024-01-17 ENCOUNTER — Ambulatory Visit: Admitting: Psychology

## 2024-01-24 ENCOUNTER — Ambulatory Visit (INDEPENDENT_AMBULATORY_CARE_PROVIDER_SITE_OTHER): Admitting: Psychology

## 2024-01-24 DIAGNOSIS — F419 Anxiety disorder, unspecified: Secondary | ICD-10-CM

## 2024-01-24 DIAGNOSIS — F900 Attention-deficit hyperactivity disorder, predominantly inattentive type: Secondary | ICD-10-CM

## 2024-01-24 DIAGNOSIS — F330 Major depressive disorder, recurrent, mild: Secondary | ICD-10-CM | POA: Diagnosis not present
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
## 2024-01-31 ENCOUNTER — Ambulatory Visit (INDEPENDENT_AMBULATORY_CARE_PROVIDER_SITE_OTHER): Admitting: Psychology

## 2024-01-31 DIAGNOSIS — F900 Attention-deficit hyperactivity disorder, predominantly inattentive type: Secondary | ICD-10-CM

## 2024-01-31 DIAGNOSIS — F330 Major depressive disorder, recurrent, mild: Secondary | ICD-10-CM

## 2024-01-31 DIAGNOSIS — F411 Generalized anxiety disorder: Secondary | ICD-10-CM | POA: Diagnosis not present
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
## 2024-02-07 ENCOUNTER — Ambulatory Visit: Admitting: Psychology

## 2024-02-14 ENCOUNTER — Ambulatory Visit: Admitting: Psychology

## 2024-02-14 DIAGNOSIS — F330 Major depressive disorder, recurrent, mild: Secondary | ICD-10-CM

## 2024-02-14 DIAGNOSIS — F900 Attention-deficit hyperactivity disorder, predominantly inattentive type: Secondary | ICD-10-CM

## 2024-02-14 DIAGNOSIS — F331 Major depressive disorder, recurrent, moderate: Secondary | ICD-10-CM

## 2024-02-14 DIAGNOSIS — F419 Anxiety disorder, unspecified: Secondary | ICD-10-CM
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
## 2024-02-21 ENCOUNTER — Ambulatory Visit (INDEPENDENT_AMBULATORY_CARE_PROVIDER_SITE_OTHER): Admitting: Psychology

## 2024-02-21 DIAGNOSIS — F330 Major depressive disorder, recurrent, mild: Secondary | ICD-10-CM

## 2024-02-21 DIAGNOSIS — F419 Anxiety disorder, unspecified: Secondary | ICD-10-CM | POA: Diagnosis not present

## 2024-02-21 DIAGNOSIS — F900 Attention-deficit hyperactivity disorder, predominantly inattentive type: Secondary | ICD-10-CM | POA: Diagnosis not present
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
## 2024-02-28 ENCOUNTER — Ambulatory Visit (INDEPENDENT_AMBULATORY_CARE_PROVIDER_SITE_OTHER): Admitting: Psychology

## 2024-02-28 DIAGNOSIS — F900 Attention-deficit hyperactivity disorder, predominantly inattentive type: Secondary | ICD-10-CM | POA: Diagnosis not present

## 2024-02-28 DIAGNOSIS — F419 Anxiety disorder, unspecified: Secondary | ICD-10-CM

## 2024-02-28 DIAGNOSIS — F330 Major depressive disorder, recurrent, mild: Secondary | ICD-10-CM

## 2024-02-28 DIAGNOSIS — F411 Generalized anxiety disorder: Secondary | ICD-10-CM
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
## 2024-03-06 ENCOUNTER — Ambulatory Visit (INDEPENDENT_AMBULATORY_CARE_PROVIDER_SITE_OTHER): Admitting: Psychology

## 2024-03-06 DIAGNOSIS — F900 Attention-deficit hyperactivity disorder, predominantly inattentive type: Secondary | ICD-10-CM

## 2024-03-06 DIAGNOSIS — F411 Generalized anxiety disorder: Secondary | ICD-10-CM

## 2024-03-06 DIAGNOSIS — F419 Anxiety disorder, unspecified: Secondary | ICD-10-CM | POA: Diagnosis not present

## 2024-03-06 DIAGNOSIS — F330 Major depressive disorder, recurrent, mild: Secondary | ICD-10-CM | POA: Diagnosis not present
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
## 2024-03-13 ENCOUNTER — Ambulatory Visit: Admitting: Psychology

## 2024-03-20 ENCOUNTER — Ambulatory Visit (INDEPENDENT_AMBULATORY_CARE_PROVIDER_SITE_OTHER): Admitting: Psychology

## 2024-03-20 DIAGNOSIS — F900 Attention-deficit hyperactivity disorder, predominantly inattentive type: Secondary | ICD-10-CM | POA: Diagnosis not present

## 2024-03-20 DIAGNOSIS — F330 Major depressive disorder, recurrent, mild: Secondary | ICD-10-CM

## 2024-03-20 DIAGNOSIS — F419 Anxiety disorder, unspecified: Secondary | ICD-10-CM | POA: Diagnosis not present
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
## 2024-03-27 ENCOUNTER — Ambulatory Visit: Admitting: Psychology

## 2024-03-28 ENCOUNTER — Ambulatory Visit (INDEPENDENT_AMBULATORY_CARE_PROVIDER_SITE_OTHER): Admitting: Psychology

## 2024-03-28 DIAGNOSIS — F900 Attention-deficit hyperactivity disorder, predominantly inattentive type: Secondary | ICD-10-CM

## 2024-03-28 DIAGNOSIS — F419 Anxiety disorder, unspecified: Secondary | ICD-10-CM | POA: Diagnosis not present

## 2024-03-28 DIAGNOSIS — F330 Major depressive disorder, recurrent, mild: Secondary | ICD-10-CM | POA: Diagnosis not present
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
## 2024-04-03 ENCOUNTER — Ambulatory Visit: Admitting: Psychology

## 2024-04-04 ENCOUNTER — Ambulatory Visit: Admitting: Psychology

## 2024-04-04 DIAGNOSIS — F419 Anxiety disorder, unspecified: Secondary | ICD-10-CM

## 2024-04-04 DIAGNOSIS — F330 Major depressive disorder, recurrent, mild: Secondary | ICD-10-CM | POA: Diagnosis not present

## 2024-04-04 DIAGNOSIS — F900 Attention-deficit hyperactivity disorder, predominantly inattentive type: Secondary | ICD-10-CM | POA: Diagnosis not present
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
## 2024-04-10 ENCOUNTER — Ambulatory Visit: Admitting: Psychology

## 2024-04-17 ENCOUNTER — Ambulatory Visit (INDEPENDENT_AMBULATORY_CARE_PROVIDER_SITE_OTHER): Admitting: Psychology

## 2024-04-17 DIAGNOSIS — F419 Anxiety disorder, unspecified: Secondary | ICD-10-CM

## 2024-04-17 DIAGNOSIS — F900 Attention-deficit hyperactivity disorder, predominantly inattentive type: Secondary | ICD-10-CM

## 2024-04-17 DIAGNOSIS — F411 Generalized anxiety disorder: Secondary | ICD-10-CM

## 2024-04-17 DIAGNOSIS — F330 Major depressive disorder, recurrent, mild: Secondary | ICD-10-CM | POA: Diagnosis not present
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
## 2024-04-24 ENCOUNTER — Ambulatory Visit: Admitting: Psychology

## 2024-05-01 ENCOUNTER — Ambulatory Visit: Admitting: Psychology

## 2024-05-01 DIAGNOSIS — F419 Anxiety disorder, unspecified: Secondary | ICD-10-CM

## 2024-05-01 DIAGNOSIS — F900 Attention-deficit hyperactivity disorder, predominantly inattentive type: Secondary | ICD-10-CM | POA: Diagnosis not present

## 2024-05-01 DIAGNOSIS — F411 Generalized anxiety disorder: Secondary | ICD-10-CM

## 2024-05-01 DIAGNOSIS — F330 Major depressive disorder, recurrent, mild: Secondary | ICD-10-CM
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
## 2024-05-08 ENCOUNTER — Ambulatory Visit: Admitting: Psychology

## 2024-05-29 ENCOUNTER — Ambulatory Visit: Admitting: Psychology

## 2024-06-05 ENCOUNTER — Ambulatory Visit: Admitting: Psychology

## 2024-06-05 DIAGNOSIS — F331 Major depressive disorder, recurrent, moderate: Secondary | ICD-10-CM

## 2024-06-05 DIAGNOSIS — F411 Generalized anxiety disorder: Secondary | ICD-10-CM
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
## 2024-06-12 ENCOUNTER — Ambulatory Visit: Admitting: Psychology

## 2024-06-12 DIAGNOSIS — F330 Major depressive disorder, recurrent, mild: Secondary | ICD-10-CM
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
## 2024-06-19 ENCOUNTER — Ambulatory Visit: Admitting: Psychology

## 2024-06-26 ENCOUNTER — Ambulatory Visit: Admitting: Psychology

## 2024-07-01 ENCOUNTER — Other Ambulatory Visit (HOSPITAL_COMMUNITY): Payer: Self-pay | Admitting: Physician Assistant

## 2024-07-01 DIAGNOSIS — R0602 Shortness of breath: Secondary | ICD-10-CM

## 2024-07-03 ENCOUNTER — Ambulatory Visit: Admitting: Psychology

## 2024-07-10 ENCOUNTER — Ambulatory Visit: Admitting: Psychology

## 2024-07-10 DIAGNOSIS — F900 Attention-deficit hyperactivity disorder, predominantly inattentive type: Secondary | ICD-10-CM | POA: Diagnosis not present

## 2024-07-10 DIAGNOSIS — F419 Anxiety disorder, unspecified: Secondary | ICD-10-CM | POA: Diagnosis not present

## 2024-07-10 DIAGNOSIS — F330 Major depressive disorder, recurrent, mild: Secondary | ICD-10-CM | POA: Diagnosis not present
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
## 2024-07-12 ENCOUNTER — Telehealth (HOSPITAL_COMMUNITY): Payer: Self-pay | Admitting: Physician Assistant

## 2024-07-12 NOTE — Telephone Encounter (Signed)
 Just an FYI. We have made several attempts to contact this patient  to schedule or reschedule their echocardiogram. We will be removing the patient from the echo WQ.      07/12/24 [facility] x 4- LAST ATTEMPT/LBW  07/06/23@929 @[facility]  EVD 07/03/24 [facility]@128 /EVD 07/01/24 [facility] @ 2:14/LBW  NO PRIOR AUTH REQUIRED FOR [id number], [id number] and [id number] (ONBASE)   Thank you

## 2024-07-17 ENCOUNTER — Ambulatory Visit: Admitting: Psychology

## 2024-07-17 DIAGNOSIS — F411 Generalized anxiety disorder: Secondary | ICD-10-CM

## 2024-07-17 DIAGNOSIS — F331 Major depressive disorder, recurrent, moderate: Secondary | ICD-10-CM

## 2024-07-17 DIAGNOSIS — F900 Attention-deficit hyperactivity disorder, predominantly inattentive type: Secondary | ICD-10-CM | POA: Diagnosis not present
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
## 2024-07-24 ENCOUNTER — Ambulatory Visit: Admitting: Psychology

## 2024-07-30 ENCOUNTER — Ambulatory Visit: Payer: Self-pay

## 2024-07-30 NOTE — Telephone Encounter (Signed)
 FYI Only or Action Required?: FYI only for provider: New pt.  Patient is followed in Pulmonology for New Pt, last seen on   Called Nurse Triage reporting Shortness of Breath.  Symptoms began several months ago.  Interventions attempted: Rescue inhaler and Increased fluids/rest.  Symptoms are: gradually worsening.  Triage Disposition: See HCP Within 4 Hours (Or PCP Triage)  Patient/caregiver understands and will follow disposition?: Yes  E2C2 Pulmonary Triage - Initial Assessment Questions Chief Complaint (e.g., cough, sob, wheezing, fever, chills, sweat or additional symptoms) *Go to specific symptom protocol after initial questions. SOB episodes   How long have symptoms been present? Since September  Have you tested for COVID or Flu? Note: If not, ask patient if a home test can be taken. If so, instruct patient to call back for positive results. No- negative  MEDICINES:   Have you used any OTC meds to help with symptoms? No If yes, ask What medications? na  Have you used your inhalers/maintenance medication? Yes If yes, What medications? albuterol  with steroid Albuterol   Budesonide- fomoterol fumerate dehydrate 80/4.5mg    If inhaler, ask How many puffs and how often? Note: Review instructions on medication in the chart. daily  OXYGEN: Do you wear supplemental oxygen? No If yes, How many liters are you supposed to use? na  Do you monitor your oxygen levels? Yes If yes, What is your reading (oxygen level) today? 92% at worst but back to normal now   What is your usual oxygen saturation reading?  (Note: Pulmonary O2 sats should be 90% or greater) 98%  Message from [place] sent at 07/30/2024  4:18 PM EST  Reason for Triage: New patient no referral. Pt sees Dr. Cassondra Sauers from [facility]. Pt had walking pneumonia and bronchitis last year around Sept 2025 that has improved, but he is experiencing spells of SOB. Wife is on the  line.   Reason for Disposition  [1] MILD difficulty breathing (e.g., minimal/no SOB at rest, SOB with walking, pulse < 100) AND [2] NEW-onset or WORSE than normal  Answer Assessment - Initial Assessment Questions Establishing as a new patient - Ongoing issues with Bronchitis and walking pneumonia since end of September. Still having SOB episodes, worse at night, anxiety.  Worse when lays down- Couldn't get a deep breath-  Felt like he needed to cough something up but nothing came up  Cough- brown phelgm this morning- metallic taste-   Timeline: -Sept-Bronchitis- albuterol - abx-  no improvement -One month later-DX Chest-  walking pneumonia- abx, steroid, air supra 3-4 weeks to get over- coughing up lots of gunk -Thanksgiving- ok  -Around christmas- not taking good deep breaths- saw PA in practice- DX chest was fine- thought it was heart- EKG blood work, ECHO ordered but didn't go- waited to talk to his MD who Recommended to try the new budesonide inhaler for 1 month- and to FU prior to pursuing cardiac workup   Anxiety- didn't have chest pain until they told him it might be his chest. More tightness than pain. Achy shoulders with anxiety last night- no sleep Denies fever. Sinus congestion with runny nose  Headaches for 6 months and has seen PT for it  Dizziness/lightheaded-  with sitting to standing   Very active mountain biker- hasn't been able to go since October.  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 15-20 shows played sick  Smoke when younger- [age] yo and quit. Bike racer after that so thinks lungs  have recovered. Occasional marijuana but slowing down due to breathing, denies vaping.  Second hand exposure- at shows. Takes breath away   Call transferred back to [facility] to schedule new Pulm patient appt. Patient understand ED/UC precautions and will continue to FU with PCP until can be seen by Pulm   1. RESPIRATORY STATUS: Describe your  breathing? (e.g., wheezing, shortness of breath, unable to speak, severe coughing)      SOB, Wheezing when laying down  2. ONSET: When did this breathing problem begin?      End of September 3. PATTERN Does the difficult breathing come and go, or has it been constant since it started?      Coming and goes- more frequent recently  4. SEVERITY: How bad is your breathing? (e.g., mild, moderate, severe)      Moderate  5. RECURRENT SYMPTOM: Have you had difficulty breathing before? If Yes, ask: When was the last time? and What happened that time?      Since September 6. CARDIAC HISTORY: Do you have any history of heart disease? (e.g., heart attack, angina, bypass surgery, angioplasty)      denies 7. LUNG HISTORY: Do you have any history of lung disease?  (e.g., pulmonary embolus, asthma, emphysema)     Ongoing issues since September 8. CAUSE: What do you think is causing the breathing problem?      Bronchitis/walking pneumonia 9. OTHER SYMPTOMS: Do you have any other symptoms? (e.g., chest pain, cough, dizziness, fever, runny nose)     Runny nose 10. O2 SATURATION MONITOR:  Do you use an oxygen saturation monitor (pulse oximeter) at home? If Yes, ask: What is your reading (oxygen level) today? What is your usual oxygen saturation reading? (e.g., 95%)       Doesn't daily- at pneumonia worst 92-95% but has been basically 97-98% since 12. TRAVEL: Have you traveled out of the country in the last month? (e.g., travel history, exposures)       [place]  Protocols used: Breathing Difficulty-A-AH

## 2024-07-31 ENCOUNTER — Ambulatory Visit: Admitting: Psychology

## 2024-07-31 ENCOUNTER — Ambulatory Visit: Payer: Self-pay

## 2024-07-31 ENCOUNTER — Ambulatory Visit

## 2024-07-31 VITALS — BP 112/80 | HR 94 | Ht 75.0 in | Wt 240.1 lb

## 2024-07-31 DIAGNOSIS — Z9109 Other allergy status, other than to drugs and biological substances: Secondary | ICD-10-CM | POA: Diagnosis not present

## 2024-07-31 DIAGNOSIS — Z87891 Personal history of nicotine dependence: Secondary | ICD-10-CM | POA: Diagnosis not present

## 2024-07-31 DIAGNOSIS — R053 Chronic cough: Secondary | ICD-10-CM

## 2024-07-31 DIAGNOSIS — F411 Generalized anxiety disorder: Secondary | ICD-10-CM

## 2024-07-31 DIAGNOSIS — J4541 Moderate persistent asthma with (acute) exacerbation: Secondary | ICD-10-CM

## 2024-07-31 DIAGNOSIS — R0602 Shortness of breath: Secondary | ICD-10-CM

## 2024-07-31 DIAGNOSIS — Z8701 Personal history of pneumonia (recurrent): Secondary | ICD-10-CM | POA: Diagnosis not present

## 2024-07-31 LAB — CBC WITH DIFFERENTIAL/PLATELET
Basophils Absolute: 0 10*3/uL (ref 0.0–0.1)
Basophils Relative: 0.8 % (ref 0.0–3.0)
Eosinophils Absolute: 0.5 10*3/uL (ref 0.0–0.7)
Eosinophils Relative: 8.5 % — ABNORMAL HIGH (ref 0.0–5.0)
HCT: 49.6 % (ref 39.0–52.0)
Hemoglobin: 17.1 g/dL — ABNORMAL HIGH (ref 13.0–17.0)
Lymphocytes Relative: 23.9 % (ref 12.0–46.0)
Lymphs Abs: 1.4 10*3/uL (ref 0.7–4.0)
MCHC: 34.6 g/dL (ref 30.0–36.0)
MCV: 84.7 fl (ref 78.0–100.0)
Monocytes Absolute: 0.5 10*3/uL (ref 0.1–1.0)
Monocytes Relative: 7.8 % (ref 3.0–12.0)
Neutro Abs: 3.5 10*3/uL (ref 1.4–7.7)
Neutrophils Relative %: 59 % (ref 43.0–77.0)
Platelets: 260 10*3/uL (ref 150.0–400.0)
RBC: 5.85 Mil/uL — ABNORMAL HIGH (ref 4.22–5.81)
RDW: 13.3 % (ref 11.5–15.5)
WBC: 6 10*3/uL (ref 4.0–10.5)

## 2024-07-31 LAB — BRAIN NATRIURETIC PEPTIDE: Pro B Natriuretic peptide (BNP): 4 pg/mL (ref 1.0–100.0)

## 2024-07-31 MED ORDER — IPRATROPIUM-ALBUTEROL 0.5-2.5 (3) MG/3ML IN SOLN: 3.0000 mL | Freq: Four times a day (QID) | RESPIRATORY_TRACT | 1 refills | Status: DC | PRN

## 2024-07-31 MED ORDER — BREZTRI AEROSPHERE 160-9-4.8 MCG/ACT IN AERO: 2.0000 | INHALATION_SPRAY | Freq: Two times a day (BID) | RESPIRATORY_TRACT | Status: AC

## 2024-07-31 MED ORDER — AZITHROMYCIN 250 MG PO TABS: ORAL_TABLET | ORAL | 0 refills | Status: AC

## 2024-07-31 MED ORDER — BREZTRI AEROSPHERE 160-9-4.8 MCG/ACT IN AERO: 2.0000 | INHALATION_SPRAY | Freq: Two times a day (BID) | RESPIRATORY_TRACT | 6 refills | Status: AC

## 2024-07-31 MED ORDER — DEXAMETHASONE 4 MG PO TABS: 4.0000 mg | ORAL_TABLET | Freq: Every day | ORAL | 0 refills | Status: AC

## 2024-07-31 NOTE — Progress Notes (Signed)
 "  New Patient Pulmonology Office Visit   Subjective:  Patient ID: Jason [name], male    DOB: Nov 28, 1965  MRN: [id number]  Referred by: No ref. provider found  CC:  Chief Complaint  Patient presents with   Medication Management    Pt states coughing and SOB, multiple head cold occurrences, not producing any sputum. Has 3 different inhalers. Pt is also experiencing anxiety which is causing bodily aches. Also has been wheezing, not being able to take good deep breaths.     HPI Jason [name] is a 59 y.o. male who is self referred to us  for cough and shortness of breath   Discussed the use of AI scribe software for clinical note transcription with the patient, who gave verbal consent to proceed.  History of Present Illness Jason [name] is a 59 year old male who presents with recurrent respiratory symptoms and difficulty breathing.  His symptoms began with a head cold in early September, progressing to bronchitis by late September. Despite treatment with antibiotics and steroids, his condition worsened, leading to a diagnosis of walking pneumonia in mid-November. He was treated with multiple courses of antibiotics, steroids, and inhalers, which eventually led to improvement by early November. However, in early December, he experienced a recurrence of symptoms, including difficulty breathing and shallow breathing, which have persisted intermittently. He describes having 'really big lungs' but feels unable to take a deep breath. A recent head cold around Christmas exacerbated his symptoms.  He has been using Symbicort  for the past week. He has a history of anxiety, which he feels may contribute to his chest pains. He experiences pins and needles sensations throughout his body after physical exertion, coughing, or during other activities, which have worsened over the past six months. He is concerned about a possible autoimmune condition due to these symptoms and hair loss.  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 He has a history of smoking cigarettes from age 72 to 82, but he quit in 1997. He occasionally smokes marijuana, which he has been trying to reduce.  He has allergies, particularly to weather changes, pollens, and possibly cats, as he lives with two dogs and a cat. He has not been diagnosed with asthma or had repeated lung problems in childhood. He has a history of manic depression and uses marijuana to manage anxiety, although he has been trying to cut back recently.    ROS Review of symptoms negative except mentioned above  Allergies: Codeine and Penicillins Current Medications[1] Past Medical History:  Diagnosis Date   Episodic paroxysmal anxiety disorder    Finger fracture, left    Hearing loss    History of ITP    HSV (herpes simplex virus) infection    Hypertriglyceridemia    Marijuana use    Metabolic syndrome    Migraines    Prediabetes    Thumb fracture    Thyroid  nodule    Tinnitus    Past Surgical History:  Procedure Laterality Date   CARPAL TUNNEL RELEASE Right 12/17/2015   Procedure: CARPAL TUNNEL RELEASE;  Surgeon: Prentice Pagan, MD;  Location: [facility] OR;  Service: Orthopedics;  Laterality: Right;   OPEN REDUCTION INTERNAL FIXATION (ORIF) DISTAL RADIAL FRACTURE Right 12/17/2015   Procedure: OPEN REDUCTION INTERNAL FIXATION (ORIF) RIGHT DISTAL RADIUS FRACTURE;  Surgeon: Prentice Pagan, MD;  Location: [facility] OR;  Service: Orthopedics;  Laterality: Right;   thumb surgery Left    WRIST SURGERY     Family History  Problem Relation Age of Onset   CAD Mother    Supraventricular tachycardia Mother    Cirrhosis Father    Social History   Socioeconomic History   Marital status: Single    Spouse name: Not on file   Number of children: Not on file   Years of  education: Not on file   Highest education level: Not on file  Occupational History   Not on file  Tobacco Use   Smoking status: Former    Current packs/day: 0.00    Types: Cigarettes    Quit date: 11/02/1995    Years since quitting: 28.7   Smokeless tobacco: Never  Substance and Sexual Activity   Alcohol use: Not Currently    Comment: Quit 2018   Drug use: Yes    Types: Marijuana   Sexual activity: Not on file  Other Topics Concern   Not on file  Social History Narrative   Not on file   Social Drivers of Health   Tobacco Use: Medium Risk (07/31/2024)   Patient History    Smoking Tobacco Use: Former    Smokeless Tobacco Use: Never    Passive Exposure: Not on [place] Strain: Not on file  Food Insecurity: Not on file  Transportation Needs: Not on file  Physical Activity: Not on file  Stress: Not on file  Social Connections: Not on file  Intimate Partner Violence: Not on file  Depression (EYV7-0): Not on file  Alcohol Screen: Not on file  Housing: Not on file  Utilities: Not on file  Health Literacy: Not on file         Objective:  BP 112/80   Pulse 94   Ht 6' 3 (1.905 m) Comment: per pt  Wt 240 lb 2 oz (108.9 kg)   SpO2 93% Comment: on RA  BMI 30.01 kg/m    Physical Exam Constitutional:      General: He is not in acute distress.    Appearance: Normal appearance.  HENT:     Mouth/Throat:     Mouth: Mucous membranes are moist.  Cardiovascular:     Rate and Rhythm: Normal rate.  Pulmonary:     Effort: No respiratory distress.     Breath sounds: No wheezing or rales.  Musculoskeletal:     Right lower leg: No edema.     Left lower leg: No edema.  Skin:    General: Skin is warm.  Neurological:     Mental Status: He is alert and oriented to person, place, and time.  Psychiatric:        Mood and Affect: Mood normal.     Diagnostic Review:    Pft     No data to display               Results       Assessment & Plan:    Assessment & Plan Moderate persistent asthma with exacerbation Discussed the symptoms, etiology, pathophysiology, diagnostic test, treatment, flare ups,  prognosis of asthma/COPD Uncontrolled symptoms Currently in flare up. Will treat with a course of steroids and antibiotics He says he doesn't handle prednisone well but does fine with other steroids Prescribed a nebulizer machine Advised pt to visit urgent care if symptoms get worse Start breztri  inhaler instead of symbicort  for maintenance inhalers Orders:   dexamethasone  (DECADRON )  4 MG tablet; Take 1 tablet (4 mg total) by mouth daily for 5 days.   azithromycin  (ZITHROMAX  Z-PAK) 250 MG tablet; Take 2 tablets on day 1, take 1 tab on day 2,3,4 and 5 and stop   budesonide -glycopyrrolate -formoterol  (BREZTRI  AEROSPHERE) 160-9-4.8 MCG/ACT AERO inhaler; Inhale 2 puffs into the lungs in the morning and at bedtime.   For home use only DME Nebulizer machine  SOB (shortness of breath) Likely from asthma/copd exacerbation Orders:   DG Chest 2 View; Future   Pulmonary function test; Future   RESPIRATORY ALLERGY PANEL REGION II W/ RFLX: [place]; Future   Brain natriuretic peptide; Future   IgE; Future   CBC with Differential/Platelet; Future   budesonide -glycopyrrolate -formoterol  (BREZTRI  AEROSPHERE) 160-9-4.8 MCG/ACT AERO inhaler; Inhale 2 puffs into the lungs in the morning and at bedtime.   For home use only DME Nebulizer machine   budesonide -glycopyrrolate -formoterol  (BREZTRI  AEROSPHERE) 160-9-4.8 MCG/ACT AERO inhaler; Inhale 2 puffs into the lungs in the morning and at bedtime.  History of pneumonia Will obtain chest xray now Orders:   For home use only DME Nebulizer machine  Chronic cough Likely from allergies, sinus drainage and obstructive airways disease Orders:   IgE; Future   CBC with Differential/Platelet; Future   For home use only DME Nebulizer machine  Environmental allergies Start taking OTC antihistaminics   Orders:   RESPIRATORY ALLERGY PANEL REGION II W/ RFLX: ; Future   For home use only DME Nebulizer machine  Strongly Advised to completely abstain from inhaling any smokes/fumes.    Thank you for the opportunity to take part in the care of Jason [name]   Return in about 6 weeks (around 09/11/2024).   Rozann Holts Pleas, MD [facility] Office: [phone]  I personally spent a total of 60 minutes in the care of the patient today including getting/reviewing separately obtained history, performing a medically appropriate exam/evaluation, counseling and educating, placing orders, documenting clinical information in the EHR, and coordinating care.     [1]  Current Outpatient Medications:    albuterol  (VENTOLIN  HFA) 108 (90 Base) MCG/ACT inhaler, SMARTSIG:2 Puff(s) By Mouth Every 6 Hours PRN, Disp: , Rfl:    azithromycin  (ZITHROMAX  Z-PAK) 250 MG tablet, Take 2 tablets on day 1, take 1 tab on day 2,3,4 and 5 and stop, Disp: 6 tablet, Rfl: 0   budesonide -glycopyrrolate -formoterol  (BREZTRI  AEROSPHERE) 160-9-4.8 MCG/ACT AERO inhaler, Inhale 2 puffs into the lungs in the morning and at bedtime., Disp: 1 each, Rfl: 6   budesonide -glycopyrrolate -formoterol  (BREZTRI  AEROSPHERE) 160-9-4.8 MCG/ACT AERO inhaler, Inhale 2 puffs into the lungs in the morning and at bedtime., Disp: , Rfl:    ipratropium-albuterol  (DUONEB) 0.5-2.5 (3) MG/3ML SOLN, Take 3 mLs by nebulization every 6 (six) hours as needed., Disp: 360 mL, Rfl: 1   cyclobenzaprine  (FLEXERIL ) 10 MG tablet, Take 1 tablet (10 mg total) by mouth 3 (three) times daily as needed for muscle spasms. (Patient not taking: Reported on 07/31/2024), Disp: 10 tablet, Rfl: 0   ibuprofen (ADVIL,MOTRIN) 200 MG tablet, Take 400-800 mg by mouth every 6 (six) hours as needed for headache or mild pain. (Patient not taking: Reported on 07/31/2024), Disp: , Rfl:    lidocaine  (LIDODERM ) 5 %, Place 1 patch onto the skin daily. Remove &  Discard patch within 12 hours or as directed by MD (Patient not taking: Reported on 07/31/2024), Disp: 30 patch, Rfl: 0   methocarbamol  (ROBAXIN ) 500 MG tablet, Take 1 tablet (  500 mg total) by mouth 2 (two) times daily. (Patient not taking: Reported on 07/31/2024), Disp: 20 tablet, Rfl: 0   naproxen  (NAPROSYN ) 375 MG tablet, Take 1 tablet (375 mg total) by mouth 2 (two) times daily. (Patient not taking: Reported on 07/31/2024), Disp: 20 tablet, Rfl: 0  "

## 2024-07-31 NOTE — Patient Instructions (Addendum)
" °  VISIT SUMMARY: During your visit, we discussed your ongoing respiratory symptoms, including difficulty breathing and chronic cough, which have persisted since September. We also addressed your environmental allergies and their potential impact on your symptoms.  YOUR PLAN: CHRONIC COUGH AND DYSPNEA: You have been experiencing persistent respiratory symptoms, including shallow breathing and difficulty taking deep breaths, since September. These symptoms were initially diagnosed as bronchitis and later as walking pneumonia. And re-occurred in Dec -We have ordered a chest x-ray to further investigate your symptoms. -We prescribed a stronger inhaler and provided a sample for you to use. Please rinse your mouth after inhaler use.  -We prescribed a nebulizer machine for use on bad days. -We prescribed a short course of steroids to help manage your symptoms, especially with your upcoming performances. -We ordered blood work to check for allergies. -We scheduled a breathing test closer to your next visit.  ENVIRONMENTAL ALLERGIES: Your symptoms may be exacerbated by environmental allergies, including weather changes and exposure to pets. -We recommend taking an over-the-counter allergy pill (Claritin, Zyrtec, or Allegra) daily for 6-8 weeks.  Strongly abstain from inhaling any smokes/fumes.     Contains text generated by [employer].   "
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
## 2024-08-01 ENCOUNTER — Telehealth: Payer: Self-pay

## 2024-08-01 ENCOUNTER — Other Ambulatory Visit (HOSPITAL_COMMUNITY): Payer: Self-pay

## 2024-08-01 LAB — RESPIRATORY ALLERGY PANEL REGION II W/ RFLX: ~~LOC~~
Allergen, A. alternata, m6: <0.1 kU/L
Allergen, Cedar tree, t12: <0.1 kU/L
Allergen, Comm Silver Birch, t9: <0.1 kU/L
Allergen, Cottonwood, t14: <0.1 kU/L
Allergen, D pternoyssinus,d7: <0.1 kU/L
Allergen, Mouse Urine Protein, e78: <0.1 kU/L
Allergen, Mulberry, t76: <0.1 kU/L
Allergen, Oak,t7: <0.1 kU/L
Allergen, P. notatum, m1: <0.1 kU/L
Aspergillus fumigatus, m3: <0.1 kU/L
Bermuda Grass: <0.1 kU/L
Box Elder IgE: <0.1 kU/L
CLADOSPORIUM HERBARUM (M2) IGE: <0.1 kU/L
COMMON RAGWEED (SHORT) (W1) IGE: <0.1 kU/L
Cat Dander: <0.1 kU/L
Class: 0
Class: 0
Class: 0
Class: 0
Class: 0
Class: 0
Class: 0
Class: 0
Class: 0
Class: 0
Class: 0
Class: 0
Class: 0
Class: 0
Class: 0
Class: 0
Class: 0
Class: 0
Class: 0
Class: 0
Class: 0
Class: 0
Class: 0
Class: 0
Cockroach: <0.1 kU/L
D. farinae: <0.1 kU/L
Dog Dander: <0.1 kU/L
Elm IgE: <0.1 kU/L
IgE (Immunoglobulin E), Serum: 54 kU/L
Johnson Grass: <0.1 kU/L
Pecan/Hickory Tree IgE: <0.1 kU/L
Rough Pigweed  IgE: <0.1 kU/L
Sheep Sorrel IgE: <0.1 kU/L
Timothy Grass: <0.1 kU/L

## 2024-08-01 LAB — INTERPRETATION:

## 2024-08-01 NOTE — Telephone Encounter (Signed)
*  Pulm  Pharmacy Patient Advocate Encounter   Received notification from Fax that prior authorization for Breztri  Aerosphere 160-9-4.8MCG/ACT aerosol   is required/requested.   Insurance verification completed.   The patient is insured through [employer].   Per test claim: The current 30 day co-pay is, $35.00.  No PA needed at this time. This test claim was processed through [facility]- copay amounts may vary at other pharmacies due to pharmacy/plan contracts, or as the patient moves through the different stages of their insurance plan.     [employer], the mfr of BREZTRI  AEROSPHERE INHALER paid 5.00 toward your plan copay  Patient also has secondary Medicaid which has step therapy criteria prior to coverage:   Medicaid covers individual LAMA inhalers, such as: Spiriva and Incruse, that can be trialed in addition to the preferred ICS/LABA agents (Advair, Symbicort, and Dulera).   After 1-3 month trial of 2 preferred ICS/LABA agents, plan will likely cover combination triple therapy inhaler.

## 2024-08-01 NOTE — Telephone Encounter (Signed)
 Key: [id number] (for Medicaid Plan)

## 2024-08-06 ENCOUNTER — Telehealth: Payer: Self-pay

## 2024-08-06 ENCOUNTER — Other Ambulatory Visit: Payer: Self-pay

## 2024-08-06 NOTE — Telephone Encounter (Signed)
 Copied from CRM #[id number]. Topic: Clinical - Order For Equipment >> Aug 06, 2024 10:31 AM Russell PARAS wrote: Reason for CRM:   Pt's wife Netty Potters is contacting clinic regarding the order provider for nebulizer machine. She would like to know where she can take the order to get a machine or if order can be sent to DME company through her insurance.  CB#  [phone]   Spoke with patient gave them DME and number

## 2024-08-07 ENCOUNTER — Ambulatory Visit: Admitting: Psychology

## 2024-08-07 ENCOUNTER — Other Ambulatory Visit: Payer: Self-pay | Admitting: *Deleted

## 2024-08-07 ENCOUNTER — Telehealth: Payer: Self-pay

## 2024-08-07 ENCOUNTER — Other Ambulatory Visit (HOSPITAL_COMMUNITY): Payer: Self-pay

## 2024-08-07 DIAGNOSIS — F900 Attention-deficit hyperactivity disorder, predominantly inattentive type: Secondary | ICD-10-CM

## 2024-08-07 DIAGNOSIS — F419 Anxiety disorder, unspecified: Secondary | ICD-10-CM

## 2024-08-07 DIAGNOSIS — F330 Major depressive disorder, recurrent, mild: Secondary | ICD-10-CM

## 2024-08-07 DIAGNOSIS — F331 Major depressive disorder, recurrent, moderate: Secondary | ICD-10-CM

## 2024-08-07 DIAGNOSIS — R0602 Shortness of breath: Secondary | ICD-10-CM

## 2024-08-07 DIAGNOSIS — J4541 Moderate persistent asthma with (acute) exacerbation: Secondary | ICD-10-CM

## 2024-08-07 MED ORDER — IPRATROPIUM-ALBUTEROL 0.5-2.5 (3) MG/3ML IN SOLN: 3.0000 mL | Freq: Four times a day (QID) | RESPIRATORY_TRACT | 1 refills | Status: AC | PRN

## 2024-08-07 MED ORDER — BREZTRI AEROSPHERE 160-9-4.8 MCG/ACT IN AERO
2.0000 | INHALATION_SPRAY | Freq: Two times a day (BID) | RESPIRATORY_TRACT | 6 refills | Status: AC
  Filled 2024-08-07 – 2024-08-08 (×2): qty 10.7, 30d supply, fill #0

## 2024-08-07 MED ORDER — ALBUTEROL SULFATE HFA 108 (90 BASE) MCG/ACT IN AERS
2.0000 | INHALATION_SPRAY | Freq: Four times a day (QID) | RESPIRATORY_TRACT | 1 refills | Status: AC | PRN
  Filled 2024-08-07: qty 18, 20d supply, fill #0
  Filled 2024-08-08: qty 18, 25d supply, fill #0
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
## 2024-08-07 NOTE — Telephone Encounter (Signed)
 I called and spoke with patient, he states he was not able to get the Breztri  from [facility] because it was $800.  I advised him of the information per our PA team and asked if he would like the prescription sent to a [facility] instead.  He was agreeable.  Prescription sent.  He was still not feeling well, he picked up all of his other medications from the pharmacy and they were reasonable prices.  I advised him to complete the antibiotic and steroids and if he is not feeling better after completing them to call us  back.  I also asked him to call us  back if he began feeling worse while on the antibiotics.  He verbalized understanding.  I called [facility] to cancel the prescription for the Breztri  and asked why the prescription was $800 and I was told it was not covered, it is not on the formulary.  Pharmacy team, Did you check the formulary for his [employer] insurance?  He said he has never used the [facility].  Thank you.

## 2024-08-08 ENCOUNTER — Telehealth (HOSPITAL_COMMUNITY): Payer: Self-pay

## 2024-08-08 ENCOUNTER — Other Ambulatory Visit (HOSPITAL_COMMUNITY): Payer: Self-pay

## 2024-08-08 NOTE — Telephone Encounter (Signed)
 Breztri  was sent to [facility] outpatient pharmacy for the patient to pick up.  Nothing further needed.

## 2024-08-09 ENCOUNTER — Other Ambulatory Visit (HOSPITAL_COMMUNITY): Payer: Self-pay

## 2024-08-14 ENCOUNTER — Ambulatory Visit: Admitting: Psychology

## 2024-08-21 ENCOUNTER — Ambulatory Visit: Admitting: Psychology

## 2024-08-28 ENCOUNTER — Ambulatory Visit: Admitting: Psychology

## 2024-09-03 ENCOUNTER — Ambulatory Visit

## 2024-09-04 ENCOUNTER — Ambulatory Visit: Admitting: Psychology

## 2024-09-11 ENCOUNTER — Ambulatory Visit: Admitting: Psychology

## 2024-09-18 ENCOUNTER — Ambulatory Visit: Admitting: Psychology

## 2024-09-25 ENCOUNTER — Ambulatory Visit: Admitting: Psychology

## 2024-10-02 ENCOUNTER — Ambulatory Visit: Admitting: Psychology

## 2024-10-09 ENCOUNTER — Ambulatory Visit: Admitting: Psychology

## 2024-10-16 ENCOUNTER — Ambulatory Visit: Admitting: Psychology

## 2024-10-23 ENCOUNTER — Ambulatory Visit: Admitting: Psychology

## 2024-10-30 ENCOUNTER — Ambulatory Visit: Admitting: Psychology

## 2024-11-06 ENCOUNTER — Ambulatory Visit: Admitting: Psychology

## 2024-11-13 ENCOUNTER — Ambulatory Visit: Admitting: Psychology

## 2024-11-20 ENCOUNTER — Ambulatory Visit: Admitting: Psychology

## 2024-11-27 ENCOUNTER — Ambulatory Visit: Admitting: Psychology

## 2024-12-04 ENCOUNTER — Ambulatory Visit: Admitting: Psychology

## 2024-12-11 ENCOUNTER — Ambulatory Visit: Admitting: Psychology

## 2024-12-18 ENCOUNTER — Ambulatory Visit: Admitting: Psychology

## 2024-12-25 ENCOUNTER — Ambulatory Visit: Admitting: Psychology

## 2025-01-01 ENCOUNTER — Ambulatory Visit: Admitting: Psychology

## 2025-01-08 ENCOUNTER — Ambulatory Visit: Admitting: Psychology

## 2025-01-15 ENCOUNTER — Ambulatory Visit: Admitting: Psychology

## 2025-01-22 ENCOUNTER — Ambulatory Visit: Admitting: Psychology
